# Patient Record
Sex: Male | Born: 1960
Health system: Southern US, Community
[De-identification: ages and names within clinical notes are randomized; demographics above are authoritative.]

## PROBLEM LIST (undated history)

## (undated) DIAGNOSIS — Z87442 Personal history of urinary calculi: Secondary | ICD-10-CM

## (undated) DIAGNOSIS — R3129 Other microscopic hematuria: Secondary | ICD-10-CM

## (undated) DIAGNOSIS — R0600 Dyspnea, unspecified: Secondary | ICD-10-CM

## (undated) DIAGNOSIS — N200 Calculus of kidney: Secondary | ICD-10-CM

## (undated) DIAGNOSIS — I10 Essential (primary) hypertension: Secondary | ICD-10-CM

## (undated) DIAGNOSIS — G4730 Sleep apnea, unspecified: Secondary | ICD-10-CM

## (undated) HISTORY — PX: LASIK: SHX215

## (undated) HISTORY — DX: Calculus of kidney: N20.0

## (undated) HISTORY — DX: Essential (primary) hypertension: I10

## (undated) HISTORY — DX: Dyspnea, unspecified: R06.00

## (undated) HISTORY — PX: CHOLECYSTECTOMY: SHX55

## (undated) HISTORY — PX: EYE SURGERY: SHX253

---

## 1997-12-22 HISTORY — PX: VASECTOMY: SHX75

## 1998-12-22 HISTORY — PX: HEMORRHOID SURGERY: SHX153

## 1999-11-18 ENCOUNTER — Encounter (HOSPITAL_BASED_OUTPATIENT_CLINIC_OR_DEPARTMENT_OTHER): Payer: Self-pay | Admitting: General Surgery

## 1999-11-22 ENCOUNTER — Encounter (INDEPENDENT_AMBULATORY_CARE_PROVIDER_SITE_OTHER): Payer: Self-pay | Admitting: *Deleted

## 1999-11-22 ENCOUNTER — Ambulatory Visit (HOSPITAL_COMMUNITY): Admission: RE | Admit: 1999-11-22 | Discharge: 1999-11-22 | Payer: Self-pay | Admitting: General Surgery

## 2007-08-13 ENCOUNTER — Ambulatory Visit: Payer: Self-pay | Admitting: Family Medicine

## 2007-08-13 ENCOUNTER — Encounter: Payer: Self-pay | Admitting: Cardiology

## 2007-08-20 ENCOUNTER — Ambulatory Visit: Payer: Self-pay | Admitting: Cardiology

## 2007-08-24 ENCOUNTER — Ambulatory Visit: Payer: Self-pay

## 2009-05-28 DIAGNOSIS — R0602 Shortness of breath: Secondary | ICD-10-CM | POA: Insufficient documentation

## 2009-05-28 DIAGNOSIS — I10 Essential (primary) hypertension: Secondary | ICD-10-CM | POA: Insufficient documentation

## 2009-09-26 ENCOUNTER — Ambulatory Visit: Payer: Self-pay | Admitting: Gastroenterology

## 2009-09-26 LAB — HM COLONOSCOPY

## 2010-12-22 HISTORY — PX: CHOLECYSTECTOMY: SHX55

## 2011-05-06 NOTE — Assessment & Plan Note (Signed)
[facility] OFFICE NOTE   NAME:Dalby, KELDAN EPLIN                         MRN:          [id number]  DATE:08/20/2007                            DOB:          1961-07-03    I was asked by Dr. Julieanne Manson to evaluate Raymond Gurney and to  consult on his dyspnea on exertion.   HISTORY OF PRESENT ILLNESS:  He is 50 years of age, married, has two  children.  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  He has been there for over 20 years.   He has gotten out of his usual exercise routine over the last several  months.  He has gained a little bit of weight.  He says that he does get  short of breath with short bursts of activity, such as playing  basketball or playing ultimate Frisbee.   He denies any chest pressure, tightness or other anginal symptoms.   His cardiac risk factors are pertinent only for a mildly elevated  cholesterol.   PAST MEDICAL HISTORY:  He has no known drug allergies.  He is on no  medications.  He does not smoke.  He does not use recreational products,  but he does drink a glass of wine a day.   SURGICAL HISTORY:  He has a history of hemorrhoidectomy in 2000,  vasectomy in 1999.   FAMILY HISTORY:  Negative for premature coronary disease.   SOCIAL HISTORY:  As above.   REVIEW OF SYSTEMS:  Other than the HPI, he does have some seasonal  allergies and some breathing problems.  He had recent PFTs which only  showed mild obstruction.   PHYSICAL EXAM:  He is a very pleasant individual, in no acute distress.  His blood pressure is 148/82, his pulse 82 and regular.  EKG from Dr.  Elisabeth Cara office is normal, except for some minimal nonspecific changes.  He is 5 feet 8, weighs 200 pounds.  HEENT:  Normocephalic, atraumatic.  PERRLA.  Extraocular movements  intact.  Sclerae are clear.  Facial symmetry is normal.  Dentition  satisfactory.  NECK:  Supple.  Carotid upstrokes are  equal bilaterally without bruits.  No JVD.  Thyroid is not enlarged.  Trachea is midline.  LUNGS:  Clear to auscultation and percussion, no rhonchi or wheezes.  HEART:  Reveals a nondisplaced PMI.  He has normal S1 and S2 without  murmur or gallop.  ABDOMINAL EXAM:  Soft, good bowel sounds, no midline bruit, no  hepatomegaly.  EXTREMITIES:  Reveal no edema.  Pulses are intact.  NEUROLOGIC EXAM:  Intact.   ASSESSMENT:  1. Dyspnea on exertion.  Probably secondary to short bursts of      physical activity.  He has gotten out of his usual exercise      program, which would lead to better conditioning.  2. Mild hyperlipidemia.   I have had a nice chat with Mr. Portman today.  I have recommended a cardio  and fat-burn program, specifically designed for him.  All questions were  answered.  I would also recommend a 2D echocardiogram to rule out any  structural heart disease.  If this is normal, I will see him back on a  p.r.n. basis.   We also reviewed the symptoms of angina and how to respond.     Thomas C. Daleen Squibb, MD, [facility]  Electronically Signed    TCW/MedQ  DD: 08/20/2007  DT: 08/21/2007  Job #: [id number]   cc:   Julieanne Manson

## 2011-06-10 ENCOUNTER — Encounter: Payer: Self-pay | Admitting: Cardiovascular Disease

## 2012-05-19 ENCOUNTER — Ambulatory Visit: Payer: Self-pay | Admitting: Family Medicine

## 2012-11-04 ENCOUNTER — Ambulatory Visit: Payer: Self-pay | Admitting: Otolaryngology

## 2015-01-17 LAB — LIPID PANEL
Cholesterol: 260 mg/dL — AB (ref 0–200)
HDL: 38 mg/dL (ref 35–70)
LDL Cholesterol: 114 mg/dL
LDL/HDL RATIO: 2.7
Triglycerides: 788 mg/dL — AB (ref 40–160)

## 2015-01-17 LAB — CBC AND DIFFERENTIAL
HCT: 44 % (ref 41–53)
Hemoglobin: 15.6 g/dL (ref 13.5–17.5)
Neutrophils Absolute: 1 /uL
Platelets: 303 10*3/uL (ref 150–399)
WBC: 4.2 10^3/mL

## 2015-01-17 LAB — HEPATIC FUNCTION PANEL
ALK PHOS: 49 U/L (ref 25–125)
ALT: 57 U/L — AB (ref 10–40)
AST: 51 U/L — AB (ref 14–40)

## 2015-01-17 LAB — PSA: PSA: 1

## 2015-01-17 LAB — TSH: TSH: 1.88 u[IU]/mL (ref 0.41–5.90)

## 2015-02-13 LAB — BASIC METABOLIC PANEL
BUN: 22 mg/dL — AB (ref 4–21)
Creatinine: 1.3 mg/dL (ref <=1.3)
GLUCOSE: 90 mg/dL
POTASSIUM: 4.4 mmol/L (ref 3.4–5.3)
SODIUM: 140 mmol/L (ref 137–147)

## 2015-06-21 DIAGNOSIS — I10 Essential (primary) hypertension: Secondary | ICD-10-CM | POA: Insufficient documentation

## 2015-06-21 DIAGNOSIS — E785 Hyperlipidemia, unspecified: Secondary | ICD-10-CM | POA: Insufficient documentation

## 2015-06-21 DIAGNOSIS — IMO0001 Reserved for inherently not codable concepts without codable children: Secondary | ICD-10-CM | POA: Insufficient documentation

## 2015-06-21 DIAGNOSIS — K111 Hypertrophy of salivary gland: Secondary | ICD-10-CM | POA: Insufficient documentation

## 2015-06-21 DIAGNOSIS — N529 Male erectile dysfunction, unspecified: Secondary | ICD-10-CM | POA: Insufficient documentation

## 2015-06-21 DIAGNOSIS — G2581 Restless legs syndrome: Secondary | ICD-10-CM | POA: Insufficient documentation

## 2015-06-21 DIAGNOSIS — E559 Vitamin D deficiency, unspecified: Secondary | ICD-10-CM | POA: Insufficient documentation

## 2015-07-09 ENCOUNTER — Other Ambulatory Visit: Payer: Self-pay | Admitting: Family Medicine

## 2015-07-09 ENCOUNTER — Other Ambulatory Visit: Payer: Self-pay

## 2015-07-09 MED ORDER — HYDROCHLOROTHIAZIDE 25 MG PO TABS: 25.0000 mg | ORAL_TABLET | Freq: Every day | ORAL | Status: DC

## 2015-07-09 MED ORDER — PRAVASTATIN SODIUM 40 MG PO TABS: 40.0000 mg | ORAL_TABLET | Freq: Every day | ORAL | Status: DC

## 2015-07-09 NOTE — Telephone Encounter (Signed)
Pt wants to know if he can get refills pravastatin (PRAVACHOL) 40 MG tablet , ropinirole (REQUIP) 2 MG tablet hydrochlorothiazide (HYDRODIURIL) 25 MG tablet 01/17/15 --  [place]   [facility] [place].   Just wants one weeks worth if possible.    Call back number [phone].

## 2015-07-10 ENCOUNTER — Other Ambulatory Visit: Payer: Self-pay | Admitting: Emergency Medicine

## 2015-07-10 ENCOUNTER — Telehealth: Payer: Self-pay | Admitting: Family Medicine

## 2015-07-10 DIAGNOSIS — E785 Hyperlipidemia, unspecified: Secondary | ICD-10-CM

## 2015-07-10 MED ORDER — PRAVASTATIN SODIUM 40 MG PO TABS: 40.0000 mg | ORAL_TABLET | Freq: Every day | ORAL | Status: DC

## 2015-08-15 ENCOUNTER — Encounter: Payer: Self-pay | Admitting: Family Medicine

## 2015-08-15 ENCOUNTER — Ambulatory Visit (INDEPENDENT_AMBULATORY_CARE_PROVIDER_SITE_OTHER): Payer: BLUE CROSS/BLUE SHIELD | Admitting: Family Medicine

## 2015-08-15 VITALS — BP 138/98 | HR 84 | Temp 98.1°F | Resp 16 | Wt 208.0 lb

## 2015-08-15 DIAGNOSIS — I10 Essential (primary) hypertension: Secondary | ICD-10-CM

## 2015-08-15 DIAGNOSIS — E785 Hyperlipidemia, unspecified: Secondary | ICD-10-CM | POA: Diagnosis not present

## 2015-08-15 DIAGNOSIS — G2581 Restless legs syndrome: Secondary | ICD-10-CM

## 2015-08-15 MED ORDER — DILTIAZEM HCL ER 180 MG PO CP24: 180.0000 mg | ORAL_CAPSULE | Freq: Every day | ORAL | Status: DC

## 2015-08-15 NOTE — Progress Notes (Signed)
Patient ID: Roger Chang., male   DOB: [birth year], 54 y.o.   MRN: [id number]    Subjective:  HPI  Hypertension follow up: Patient was last seen in February 2016. No changes were made at that time Renal was checked at that time. B/P at last office visit was 128/80. Patient has noticed his B/P has been running higher. He started to get swelling in his left foot Monday-8/22 and went to work to the nurse there and they said it is probably gout-Uric acid was 7.5. Patient went to ortho to make sure same day and they told him there it was not gut but arthritis type that is brought on by dehydration and was started on Indomethacin TID. Patient states he has felt dehydrated-dry mouth all the time and feels sluggish, denies chest pain, SOB or numbness,   B/P readings patient has been getting: April-155/102 May 141/97 July 148/99 Past Monday August 22-154/101   BP Readings from Last 3 Encounters:  08/15/15 138/98  02/13/15 128/80    Hyperlipidemia follow up: Last Lipid check was on 01/17/15 when he had CPE with [place]. He is on Pravastatin and is not having side effects. Lab Results  Component Value Date   CHOL 260* 01/17/2015   HDL 38 01/17/2015   LDLCALC 114 01/17/2015   TRIG 788* 01/17/2015    Prior to Admission medications   Medication Sig Start Date End Date Taking? Authorizing Provider  aspirin 81 MG tablet Take by mouth. 03/18/12   Historical Provider, MD  cholecalciferol (VITAMIN D) 1000 UNITS tablet Take by mouth. 01/15/12   Historical Provider, MD  hydrochlorothiazide (HYDRODIURIL) 25 MG tablet Take 1 tablet (25 mg total) by mouth daily. 07/09/15   Gwyneth Fernandez Hulen Shouts., MD  pravastatin (PRAVACHOL) 40 MG tablet Take 1 tablet (40 mg total) by mouth daily. 07/10/15   Dimitria Ketchum Hulen Shouts., MD  rOPINIRole (REQUIP) 2 MG tablet Take by mouth. 08/14/14   Historical Provider, MD    Patient Active Problem List   Diagnosis Date Noted  . Failure of erection 06/21/2015  . Brash 06/21/2015   . Essential (primary) hypertension 06/21/2015  . HLD (hyperlipidemia) 06/21/2015  . Hypertrophy of salivary gland 06/21/2015  . Restless leg 06/21/2015  . Avitaminosis D 06/21/2015  . Essential hypertension 05/28/2009  . DYSPNEA 05/28/2009    Past Medical History  Diagnosis Date  . Hypertension     Unspecified  . Dyspnea     Social History   Social History  . Marital Status: Married    Spouse Name: N/A  . Number of Children: N/A  . Years of Education: N/A   Occupational History  . Full time    Social History Main Topics  . Smoking status: Never Smoker   . Smokeless tobacco: Never Used  . Alcohol Use: Yes  . Drug Use: No  . Sexual Activity: Not on file   Other Topics Concern  . Not on file   Social History Narrative   Pt gets regular exercise.    Allergies  Allergen Reactions  . Amlodipine Swelling    Swelling in extremeties more than mild, pt was not comfortable with that  . Hydrochlorothiazide Swelling    Swelling of the foot, possibly contributed to developing gout, felt dehydrated, B/P not controlled  . Lisinopril Swelling    Swelling of the lips and dry mouth  . Losartan Other (See Comments)    Weird dreams, ED, swelling    Review of Systems  Constitutional: Positive for  malaise/fatigue. Negative for fever and chills.  HENT:       Dry mouth  Respiratory: Negative.   Cardiovascular: Negative.   Gastrointestinal: Negative.   Genitourinary: Positive for frequency.  Musculoskeletal: Negative.   Neurological: Positive for weakness. Negative for dizziness and tingling.  Psychiatric/Behavioral: Negative.     Immunization History  Administered Date(s) Administered  . Tdap 08/13/2007   Objective:  BP 138/98 mmHg  Pulse 84  Temp(Src) 98.1 F (36.7 C)  Resp 16  Wt 208 lb (94.348 kg)  Physical Exam  Constitutional: He is oriented to person, place, and time and well-developed, well-nourished, and in no distress.  HENT:  Head: Normocephalic and  atraumatic.  Right Ear: External ear normal.  Left Ear: External ear normal.  Nose: Nose normal.  Eyes: Conjunctivae are normal.  Neck: Neck supple.  Cardiovascular: Normal rate, regular rhythm and normal heart sounds.   Pulmonary/Chest: Effort normal and breath sounds normal.  Abdominal: Soft.  Musculoskeletal:  Tender and warm of her left foot/ankle  Neurological: He is alert and oriented to person, place, and time. Gait normal.  Skin: Skin is warm and dry.  Psychiatric: Mood, memory, affect and judgment normal.    Lab Results  Component Value Date   WBC 4.2 01/17/2015   HGB 15.6 01/17/2015   HCT 44 01/17/2015   PLT 303 01/17/2015   CHOL 260* 01/17/2015   TRIG 788* 01/17/2015   HDL 38 01/17/2015   LDLCALC 114 01/17/2015   TSH 1.88 01/17/2015   PSA 1.0 01/17/2015    CMP     Component Value Date/Time   NA 140 02/13/2015   K 4.4 02/13/2015   BUN 22* 02/13/2015   CREATININE 1.3 02/13/2015   AST 51* 01/17/2015   ALT 57* 01/17/2015   ALKPHOS 49 01/17/2015    Assessment and Plan :  1. Essential hypertension B/P not controlled. HCTZ contributing to foot swelling and possible gout. Stop HCTZ and try Diltiazem. - diltiazem (DILACOR XR) 180 MG 24 hr capsule; Take 1 capsule (180 mg total) by mouth daily.  Dispense: 30 capsule; Refill: 12  2. Restless leg Stable.  3. HLD (hyperlipidemia) Stable. 4.Gout Check UA. Recheck 2 weeks.   Patient was seen and examined by Dr. Bosie Clos and note was scribed by Samara Deist, RMA.    Julieanne Manson MD [facility] 08/15/2015 9:04 AM

## 2015-09-02 ENCOUNTER — Other Ambulatory Visit: Payer: Self-pay | Admitting: Family Medicine

## 2015-09-04 ENCOUNTER — Ambulatory Visit
Admission: RE | Admit: 2015-09-04 | Discharge: 2015-09-04 | Disposition: A | Discharge status: Home / Self Care | Payer: BLUE CROSS/BLUE SHIELD | Source: Ambulatory Visit | Attending: Family Medicine | Admitting: Family Medicine

## 2015-09-04 ENCOUNTER — Encounter: Payer: Self-pay | Admitting: Family Medicine

## 2015-09-04 ENCOUNTER — Ambulatory Visit (INDEPENDENT_AMBULATORY_CARE_PROVIDER_SITE_OTHER): Payer: BLUE CROSS/BLUE SHIELD | Admitting: Family Medicine

## 2015-09-04 VITALS — BP 144/88 | HR 82 | Temp 98.0°F | Resp 16 | Wt 210.0 lb

## 2015-09-04 DIAGNOSIS — R0602 Shortness of breath: Secondary | ICD-10-CM | POA: Diagnosis not present

## 2015-09-04 DIAGNOSIS — R5383 Other fatigue: Secondary | ICD-10-CM | POA: Diagnosis not present

## 2015-09-04 NOTE — Progress Notes (Signed)
Patient ID: Roger Burandt., male   DOB: 04-Nov-1961, 54 y.o.   MRN: [id number]    Subjective:  HPI Pt reports that he has been having shortness of breath. He reports that he has this with exertion and when laying down. He reports that he had to stop working out due to a tendon injury and foot injury and it just started working out again about a week and a half ago. He did have the SOB with excretion at that time but not when laying down. SOB when laying down started about 4 days ago. He denies any chest pain. He does report that he is tired and has not been sleeping well due to this. He reports that he has not had any increased stress in his life recently. Denies dizziness or lightheadedness. No history of asthma and has no URI symptoms.   Prior to Admission medications   Medication Sig Start Date End Date Taking? Authorizing Provider  aspirin 81 MG tablet Take by mouth. 03/18/12  Yes Historical Provider, MD  cholecalciferol (VITAMIN D) 1000 UNITS tablet Take by mouth. 01/15/12  Yes Historical Provider, MD  diltiazem (DILACOR XR) 180 MG 24 hr capsule Take 1 capsule (180 mg total) by mouth daily. 08/15/15  Yes Richard Hulen Shouts., MD  pravastatin (PRAVACHOL) 40 MG tablet Take 1 tablet (40 mg total) by mouth daily. 07/10/15  Yes Richard Hulen Shouts., MD  rOPINIRole (REQUIP) 2 MG tablet take 1/2 to 1 tablet by mouth at bedtime 09/03/15  Yes Richard Hulen Shouts., MD    Patient Active Problem List   Diagnosis Date Noted  . Failure of erection 06/21/2015  . Brash 06/21/2015  . Essential (primary) hypertension 06/21/2015  . HLD (hyperlipidemia) 06/21/2015  . Hypertrophy of salivary gland 06/21/2015  . Restless leg 06/21/2015  . Avitaminosis D 06/21/2015  . Essential hypertension 05/28/2009  . DYSPNEA 05/28/2009    Past Medical History  Diagnosis Date  . Hypertension     Unspecified  . Dyspnea     Social History   Social History  . Marital Status: Married    Spouse Name: N/A  . Number  of Children: N/A  . Years of Education: N/A   Occupational History  . Full time    Social History Main Topics  . Smoking status: Never Smoker   . Smokeless tobacco: Never Used  . Alcohol Use: Yes     Comment: occasionally  . Drug Use: No  . Sexual Activity: Not on file   Other Topics Concern  . Not on file   Social History Narrative   Pt gets regular exercise.    Allergies  Allergen Reactions  . Amlodipine Swelling    Swelling in extremeties more than mild, pt was not comfortable with that  . Hydrochlorothiazide Swelling    Swelling of the foot, possibly contributed to developing gout, felt dehydrated, B/P not controlled  . Lisinopril Swelling    Swelling of the lips and dry mouth  . Losartan Other (See Comments)    Weird dreams, ED, swelling    Review of Systems  Constitutional: Positive for malaise/fatigue.  HENT: Negative.   Eyes: Negative.   Respiratory: Positive for shortness of breath.   Cardiovascular: Positive for orthopnea.  Gastrointestinal: Negative.   Genitourinary: Negative.   Musculoskeletal: Negative.   Skin: Negative.   Neurological: Negative.   Endo/Heme/Allergies: Negative.   Psychiatric/Behavioral: Negative.     Immunization History  Administered Date(s) Administered  . Tdap 08/13/2007  Objective:  BP 144/88 mmHg  Pulse 82  Temp(Src) 98 F (36.7 C) (Oral)  Resp 16  Wt 210 lb (95.255 kg)  SpO2 99%  Physical Exam  Constitutional: He is oriented to person, place, and time and well-developed, well-nourished, and in no distress.  HENT:  Head: Normocephalic and atraumatic.  Right Ear: External ear normal.  Left Ear: External ear normal.  Nose: Nose normal.  Eyes: Conjunctivae are normal.  Neck: Neck supple.  Cardiovascular: Normal rate, regular rhythm and normal heart sounds.   Pulmonary/Chest: Effort normal and breath sounds normal.  Abdominal: Soft.  Musculoskeletal: He exhibits no edema.  Neurological: He is alert and oriented  to person, place, and time.  Skin: Skin is warm and dry.  Psychiatric: Mood, memory, affect and judgment normal.    Lab Results  Component Value Date   WBC 4.2 01/17/2015   HGB 15.6 01/17/2015   HCT 44 01/17/2015   PLT 303 01/17/2015   CHOL 260* 01/17/2015   TRIG 788* 01/17/2015   HDL 38 01/17/2015   LDLCALC 114 01/17/2015   TSH 1.88 01/17/2015   PSA 1.0 01/17/2015    CMP     Component Value Date/Time   NA 140 02/13/2015   K 4.4 02/13/2015   BUN 22* 02/13/2015   CREATININE 1.3 02/13/2015   AST 51* 01/17/2015   ALT 57* 01/17/2015   ALKPHOS 49 01/17/2015    Assessment and Plan :  1. Shortness of breath Refer to  cardiology for evaluation. Aspirin 81 mg daily - EKG 12-Lead - CBC with Differential - Comprehensive Metabolic Panel (CMET) - TSH - DG Chest 2 View; Future  2. Other fatigue Consider continuing/progressive sleep apnea.  - CBC with Differential - Comprehensive Metabolic Panel (CMET) - TSH  Julieanne Manson MD [facility] 09/04/2015 2:42 PM

## 2015-09-05 LAB — CBC WITH DIFFERENTIAL/PLATELET
BASOS ABS: 0 10*3/uL (ref 0.0–0.2)
BASOS: 1 %
EOS (ABSOLUTE): 0 10*3/uL (ref 0.0–0.4)
Eos: 1 %
Hematocrit: 45 % (ref 37.5–51.0)
Hemoglobin: 15.8 g/dL (ref 12.6–17.7)
IMMATURE GRANS (ABS): 0 10*3/uL (ref 0.0–0.1)
IMMATURE GRANULOCYTES: 0 %
LYMPHS: 47 %
Lymphocytes Absolute: 2.4 10*3/uL (ref 0.7–3.1)
MCH: 31.2 pg (ref 26.6–33.0)
MCHC: 35.1 g/dL (ref 31.5–35.7)
MCV: 89 fL (ref 79–97)
MONOS ABS: 0.5 10*3/uL (ref 0.1–0.9)
Monocytes: 10 %
NEUTROS PCT: 41 %
Neutrophils Absolute: 2.1 10*3/uL (ref 1.4–7.0)
PLATELETS: 295 10*3/uL (ref 150–379)
RBC: 5.07 x10E6/uL (ref 4.14–5.80)
RDW: 14.2 % (ref 12.3–15.4)
WBC: 5.1 10*3/uL (ref 3.4–10.8)

## 2015-09-05 LAB — TSH: TSH: 2.59 u[IU]/mL (ref 0.450–4.500)

## 2015-09-05 LAB — COMPREHENSIVE METABOLIC PANEL
A/G RATIO: 2 (ref 1.1–2.5)
ALT: 26 IU/L (ref 0–44)
AST: 25 IU/L (ref 0–40)
Albumin: 4.8 g/dL (ref 3.5–5.5)
Alkaline Phosphatase: 38 IU/L — ABNORMAL LOW (ref 39–117)
BUN/Creatinine Ratio: 11 (ref 9–20)
BUN: 13 mg/dL (ref 6–24)
Bilirubin Total: 0.4 mg/dL (ref 0.0–1.2)
CALCIUM: 9.8 mg/dL (ref 8.7–10.2)
CO2: 26 mmol/L (ref 18–29)
Chloride: 99 mmol/L (ref 97–108)
Creatinine, Ser: 1.19 mg/dL (ref 0.76–1.27)
GFR, EST AFRICAN AMERICAN: 78 mL/min/{1.73_m2} (ref >59)
GFR, EST NON AFRICAN AMERICAN: 68 mL/min/{1.73_m2} (ref >59)
GLUCOSE: 96 mg/dL (ref 65–99)
Globulin, Total: 2.4 g/dL (ref 1.5–4.5)
POTASSIUM: 4.2 mmol/L (ref 3.5–5.2)
Sodium: 142 mmol/L (ref 134–144)
TOTAL PROTEIN: 7.2 g/dL (ref 6.0–8.5)

## 2015-09-18 ENCOUNTER — Ambulatory Visit (INDEPENDENT_AMBULATORY_CARE_PROVIDER_SITE_OTHER): Payer: BLUE CROSS/BLUE SHIELD | Admitting: Family Medicine

## 2015-09-18 ENCOUNTER — Encounter: Payer: Self-pay | Admitting: Family Medicine

## 2015-09-18 VITALS — BP 142/88 | HR 80 | Temp 98.9°F | Resp 16 | Ht 68.5 in | Wt 209.0 lb

## 2015-09-18 DIAGNOSIS — R0602 Shortness of breath: Secondary | ICD-10-CM | POA: Diagnosis not present

## 2015-09-18 DIAGNOSIS — I10 Essential (primary) hypertension: Secondary | ICD-10-CM

## 2015-09-18 DIAGNOSIS — G479 Sleep disorder, unspecified: Secondary | ICD-10-CM | POA: Diagnosis not present

## 2015-09-18 MED ORDER — DILTIAZEM HCL ER COATED BEADS 360 MG PO CP24: 360.0000 mg | ORAL_CAPSULE | Freq: Every day | ORAL | Status: DC

## 2015-09-18 NOTE — Progress Notes (Signed)
Patient ID: Roger Chang., male   DOB: 1961-03-22, 54 y.o.   MRN: [id number]        Patient: Roger Chang. Male    DOB: 1961/03/27   54 y.o.   MRN: [id number] Visit Date: 09/18/2015  Today's Provider: Megan Mans, MD   Chief Complaint  Patient presents with  . Hypertension   Subjective:    HPI   Hypertension, follow-up:  BP Readings from Last 3 Encounters:  09/18/15 142/88  09/04/15 144/88  08/15/15 138/98    He was last seen for hypertension 4 weeks ago.  BP at that visit was 144/88. Management changes since that visit include D/C HCTZ and starting Diltiazem. He reports excellent compliance with treatment. He is not having side effects.  He is exercising. He is not adherent to low salt diet.   Outside blood pressures are not being checked. He is experiencing none.  Patient denies chest pain.   Cardiovascular risk factors include none.  Use of agents associated with hypertension: none.     Weight trend: stable Wt Readings from Last 3 Encounters:  09/18/15 209 lb (94.802 kg)  09/04/15 210 lb (95.255 kg)  08/15/15 208 lb (94.348 kg)    Current diet: in general, a "healthy" diet    ------------------------------------------------------------------------       Allergies  Allergen Reactions  . Amlodipine Swelling    Swelling in extremeties more than mild, pt was not comfortable with that  . Hydrochlorothiazide Swelling    Swelling of the foot, possibly contributed to developing gout, felt dehydrated, B/P not controlled  . Lisinopril Swelling    Swelling of the lips and dry mouth  . Losartan Other (See Comments)    Weird dreams, ED, swelling   Previous Medications   ASPIRIN 81 MG TABLET    Take by mouth.   CHOLECALCIFEROL (VITAMIN D) 1000 UNITS TABLET    Take by mouth.   DILTIAZEM (DILACOR XR) 180 MG 24 HR CAPSULE    Take 1 capsule (180 mg total) by mouth daily.   PRAVASTATIN (PRAVACHOL) 40 MG TABLET    Take 1 tablet (40 mg total) by mouth  daily.   ROPINIROLE (REQUIP) 2 MG TABLET    take 1/2 to 1 tablet by mouth at bedtime    Review of Systems  Constitutional: Negative.   Respiratory: Negative.   Cardiovascular: Negative.   Endocrine: Negative.   Allergic/Immunologic: Negative.   Neurological: Negative.   Psychiatric/Behavioral: Negative.     Social History  Substance Use Topics  . Smoking status: Never Smoker   . Smokeless tobacco: Never Used  . Alcohol Use: Yes     Comment: occasionally   Objective:   BP 142/88 mmHg  Pulse 80  Temp(Src) 98.9 F (37.2 C) (Oral)  Resp 16  Ht 5' 8.5" (1.74 m)  Wt 209 lb (94.802 kg)  BMI 31.31 kg/m2  SpO2 98%  Physical Exam  Constitutional: He is oriented to person, place, and time. He appears well-developed and well-nourished.  HENT:  Head: Normocephalic and atraumatic.  Right Ear: External ear normal.  Left Ear: External ear normal.  Nose: Nose normal.  Eyes: Conjunctivae are normal.  Neck: Neck supple.  Cardiovascular: Normal rate, regular rhythm and normal heart sounds.   Pulmonary/Chest: Effort normal.  Abdominal: Soft.  Musculoskeletal: Normal range of motion. He exhibits no edema.  Neurological: He is alert and oriented to person, place, and time.  Skin: Skin is warm and dry.  Psychiatric: He has a  normal mood and affect. His behavior is normal. Judgment and thought content normal.        Assessment & Plan:     1. Essential hypertension Mild edema improved off of Norvasc. - diltiazem (CARDIZEM CD) 360 MG 24 hr capsule; Take 1 capsule (360 mg total) by mouth daily.  Dispense: 30 capsule; Refill: 12  2. Shortness of breath  - Ambulatory referral to Cardiology  3. Sleep disorder Overweight but not obese. Sleep study showed no frank sleep apnea. Pt wore mouthpiece for his snoring which did help. Refer to Dr Irving Shows to go over his options with a sleep disorder that is not frank sleep apnea. I do think that it is weight related to some degree. - Ambulatory  referral to ENT 4. HLD 5.RLS      Richard Wendelyn Breslow, MD  [facility]

## 2015-11-19 ENCOUNTER — Ambulatory Visit (INDEPENDENT_AMBULATORY_CARE_PROVIDER_SITE_OTHER): Payer: BLUE CROSS/BLUE SHIELD | Admitting: Family Medicine

## 2015-11-19 ENCOUNTER — Encounter: Payer: Self-pay | Admitting: Family Medicine

## 2015-11-19 VITALS — BP 130/84 | HR 72 | Temp 98.4°F | Resp 18 | Wt 212.0 lb

## 2015-11-19 DIAGNOSIS — T148 Other injury of unspecified body region: Secondary | ICD-10-CM | POA: Diagnosis not present

## 2015-11-19 DIAGNOSIS — I10 Essential (primary) hypertension: Secondary | ICD-10-CM | POA: Diagnosis not present

## 2015-11-19 DIAGNOSIS — K219 Gastro-esophageal reflux disease without esophagitis: Secondary | ICD-10-CM

## 2015-11-19 DIAGNOSIS — J00 Acute nasopharyngitis [common cold]: Secondary | ICD-10-CM | POA: Diagnosis not present

## 2015-11-19 DIAGNOSIS — T148XXA Other injury of unspecified body region, initial encounter: Secondary | ICD-10-CM

## 2015-11-19 DIAGNOSIS — IMO0001 Reserved for inherently not codable concepts without codable children: Secondary | ICD-10-CM

## 2015-11-19 MED ORDER — RANITIDINE HCL 150 MG PO CAPS: 150.0000 mg | ORAL_CAPSULE | Freq: Two times a day (BID) | ORAL | Status: DC

## 2015-11-19 MED ORDER — AZITHROMYCIN 250 MG PO TABS: ORAL_TABLET | ORAL | Status: DC

## 2015-11-19 NOTE — Progress Notes (Signed)
Patient ID: Roger CrewsDavid W Cowens Jr., male   DOB: 10/08/1961, 54 y.o.   MRN: [id number]    Subjective:  HPI  Patient is here to follow up from his September visit. He did see ENT and he was put on Nasocort to use every morning, he was advised it will take about 1 month before he sees any improvement possibly. He was advised he will not need another sleep study at this time.  He saw Dr. Gwen PoundsKowalski also his Diltiazem was decreased to 180 mg and labetolol was added and he is taking 200 mg now. His B/P has been running when he checks it about 150/88 and 148/90. He does mention ever since he was put on Diltiazem but at 360 mg he started to get acid reflux when he laid down in the evening, Dr. Gwen PoundsKowalski nurse advised patient to take Zantac twice daily OTC as needed and he noticed that did help him but he does not take it every day only when he has trouble with it and it has become more frequent though.  He has not been sleeping well and feels more sluggish. He did start feeling bad about 3 days ago and symptoms include hoarseness, post nasal drainage, blowing up thick green mucus, no fever, no chills, no body aches. He does have some sinus pressure above his eyes. He is coughing also.  He also wants to have his left side of the rib/back looked at, this past Saturday he got hit by a tree branch. Wants to make sure that its just a bruise.  Prior to Admission medications   Medication Sig Start Date End Date Taking? Authorizing Provider  aspirin 81 MG tablet Take by mouth. 03/18/12  Yes Historical Provider, MD  cholecalciferol (VITAMIN D) 1000 UNITS tablet Take by mouth. 01/15/12  Yes Historical Provider, MD  diltiazem (CARDIZEM CD) 180 MG 24 hr capsule  11/04/15  Yes Historical Provider, MD  labetalol (NORMODYNE) 200 MG tablet Take by mouth. 11/05/15 11/04/16 Yes Historical Provider, MD  pravastatin (PRAVACHOL) 40 MG tablet Take 1 tablet (40 mg total) by mouth daily. 07/10/15  Yes Richard Hulen ShoutsL Gilbert Jr., MD  rOPINIRole  (REQUIP) 2 MG tablet take 1/2 to 1 tablet by mouth at bedtime 09/03/15  Yes Richard Hulen ShoutsL Gilbert Jr., MD    Patient Active Problem List   Diagnosis Date Noted  . Failure of erection 06/21/2015  . Brash 06/21/2015  . Essential (primary) hypertension 06/21/2015  . HLD (hyperlipidemia) 06/21/2015  . Hypertrophy of salivary gland 06/21/2015  . Restless leg 06/21/2015  . Avitaminosis D 06/21/2015  . Essential hypertension 05/28/2009  . DYSPNEA 05/28/2009    Past Medical History  Diagnosis Date  . Hypertension     Unspecified  . Dyspnea     Social History   Social History  . Marital Status: Married    Spouse Name: N/A  . Number of Children: N/A  . Years of Education: N/A   Occupational History  . Full time    Social History Main Topics  . Smoking status: Never Smoker   . Smokeless tobacco: Never Used  . Alcohol Use: Yes     Comment: occasionally  . Drug Use: No  . Sexual Activity: Not on file   Other Topics Concern  . Not on file   Social History Narrative   Pt gets regular exercise.    Allergies  Allergen Reactions  . Amlodipine Swelling    Swelling in extremeties more than mild, pt was not comfortable with  that  . Hydrochlorothiazide Swelling    Swelling of the foot, possibly contributed to developing gout, felt dehydrated, B/P not controlled  . Lisinopril Swelling    Swelling of the lips and dry mouth  . Losartan Other (See Comments)    Weird dreams, ED, swelling    Review of Systems  Constitutional: Positive for malaise/fatigue. Negative for fever and chills.  HENT: Positive for congestion.   Eyes: Negative.   Respiratory: Positive for cough.   Cardiovascular: Negative.   Gastrointestinal: Positive for heartburn.  Musculoskeletal: Positive for myalgias.  Skin: Negative.   Neurological: Negative.   Endo/Heme/Allergies: Negative.   Psychiatric/Behavioral: The patient has insomnia.     Immunization History  Administered Date(s) Administered  . Tdap  08/13/2007   Objective:  BP 130/84 mmHg  Pulse 72  Temp(Src) 98.4 F (36.9 C)  Resp 18  Wt 212 lb (96.163 kg)  SpO2 98%  Physical Exam  Constitutional: He is oriented to person, place, and time and well-developed, well-nourished, and in no distress.  HENT:  Head: Normocephalic.  Right Ear: External ear normal.  Left Ear: External ear normal.  Mouth/Throat: Oropharynx is clear and moist.  Eyes: Conjunctivae are normal. Pupils are equal, round, and reactive to light. Right eye exhibits no discharge. Left eye exhibits no discharge.  Neck: Normal range of motion. Neck supple.  Cardiovascular: Normal rate, regular rhythm, normal heart sounds and intact distal pulses.   No murmur heard. Pulmonary/Chest: Effort normal and breath sounds normal. No respiratory distress. He has no wheezes. He has no rales.  Abdominal: Soft. There is no tenderness. There is no rebound.  Neurological: He is alert and oriented to person, place, and time. Gait normal. Coordination normal.  Skin:  Baseball size bruise with slight abrasion in the left flank just below the ribs.  Psychiatric: Mood, memory, affect and judgment normal.    Lab Results  Component Value Date   WBC 5.1 09/04/2015   HGB 15.6 01/17/2015   HCT 45.0 09/04/2015   PLT 303 01/17/2015   GLUCOSE 96 09/04/2015   CHOL 260* 01/17/2015   TRIG 788* 01/17/2015   HDL 38 01/17/2015   LDLCALC 114 01/17/2015   TSH 2.590 09/04/2015   PSA 1.0 01/17/2015    CMP     Component Value Date/Time   NA 142 09/04/2015 1549   K 4.2 09/04/2015 1549   CL 99 09/04/2015 1549   CO2 26 09/04/2015 1549   GLUCOSE 96 09/04/2015 1549   BUN 13 09/04/2015 1549   CREATININE 1.19 09/04/2015 1549   CREATININE 1.3 02/13/2015   CALCIUM 9.8 09/04/2015 1549   PROT 7.2 09/04/2015 1549   ALBUMIN 4.8 09/04/2015 1549   AST 25 09/04/2015 1549   ALT 26 09/04/2015 1549   ALKPHOS 38* 09/04/2015 1549   BILITOT 0.4 09/04/2015 1549   GFRNONAA 68 09/04/2015 1549   GFRAA  78 09/04/2015 1549    Assessment and Plan :  1. Essential hypertension Stable. Advised patient that Labetolol is most likely causing the sluggish feeling. Will follow.  2. Gastroesophageal reflux disease without esophagitis Will start Zantac RX strength and follow. Advised patient to raise the head of his bed also. - ranitidine (ZANTAC) 150 MG capsule; Take 1 capsule (150 mg total) by mouth 2 (two) times daily.  Dispense: 60 capsule; Refill: 12  3. Bruising from trauma Of left flank area. Exam is normal today besides bruising and a small abrasion. Will follow.  4. Cold Advised patient to rest today, push fluids. -  azithromycin (ZITHROMAX) 250 MG tablet; As directed  Dispense: 6 tablet; Refill: 0.  If he worsens. 5.Possible mild OSA  Patient was seen and examined by Dr. Bosie Clos and note was scribed by Samara Deist, RMA.    Julieanne Manson MD [facility] 11/19/2015 9:05 AM

## 2016-01-22 ENCOUNTER — Encounter: Payer: BLUE CROSS/BLUE SHIELD | Admitting: Family Medicine

## 2016-01-30 ENCOUNTER — Encounter: Payer: BLUE CROSS/BLUE SHIELD | Admitting: Family Medicine

## 2016-02-20 ENCOUNTER — Encounter: Payer: Self-pay | Admitting: Family Medicine

## 2016-02-20 ENCOUNTER — Ambulatory Visit (INDEPENDENT_AMBULATORY_CARE_PROVIDER_SITE_OTHER): Payer: BLUE CROSS/BLUE SHIELD | Admitting: Family Medicine

## 2016-02-20 VITALS — BP 128/72 | Temp 98.4°F | Resp 16 | Wt 208.0 lb

## 2016-02-20 DIAGNOSIS — R0683 Snoring: Secondary | ICD-10-CM

## 2016-02-20 DIAGNOSIS — R3129 Other microscopic hematuria: Secondary | ICD-10-CM | POA: Diagnosis not present

## 2016-02-20 DIAGNOSIS — Z1211 Encounter for screening for malignant neoplasm of colon: Secondary | ICD-10-CM | POA: Diagnosis not present

## 2016-02-20 DIAGNOSIS — Z125 Encounter for screening for malignant neoplasm of prostate: Secondary | ICD-10-CM

## 2016-02-20 DIAGNOSIS — Z0000 Encounter for general adult medical examination without abnormal findings: Secondary | ICD-10-CM

## 2016-02-20 LAB — POCT URINALYSIS DIPSTICK
BILIRUBIN UA: NEGATIVE
GLUCOSE UA: NEGATIVE
Ketones, UA: NEGATIVE
Leukocytes, UA: NEGATIVE
Nitrite, UA: NEGATIVE
Protein, UA: NEGATIVE
SPEC GRAV UA: 1.01
UROBILINOGEN UA: 0.2
pH, UA: 7

## 2016-02-20 LAB — POCT UA - MICROSCOPIC ONLY
BACTERIA, U MICROSCOPIC: NEGATIVE
CASTS, UR, LPF, POC: NEGATIVE
CRYSTALS, UR, HPF, POC: NEGATIVE
EPITHELIAL CELLS, URINE PER MICROSCOPY: NEGATIVE
MUCUS UA: NEGATIVE
WBC, Ur, HPF, POC: NEGATIVE
Yeast, UA: NEGATIVE

## 2016-02-20 LAB — IFOBT (OCCULT BLOOD): IMMUNOLOGICAL FECAL OCCULT BLOOD TEST: NEGATIVE

## 2016-02-20 NOTE — Progress Notes (Signed)
Patient ID: Roger Adrian., male   DOB: [birth year], 55 y.o.   MRN: [id number]       Patient: Roger Manheim., Male    DOB: [birth year], 55 y.o.   MRN: [id number] Visit Date: 02/20/2016  Today's Provider: Megan Mans, MD   Chief Complaint  Patient presents with  . Annual Exam   Subjective:    Annual physical exam Roger Terhune. is a 55 y.o. male who presents today for health maintenance and complete physical. He feels well. He reports exercising about 5 days a week. He reports he is sleeping well. He sleeps on average 7hrs a night.   Last: Colonoscopy-09/26/2009. Repeat in 10 years.  Tdap- 08/13/2007.      Review of Systems  Constitutional: Negative.   HENT: Negative.   Eyes: Negative.   Respiratory: Negative.   Cardiovascular: Negative.   Gastrointestinal: Negative.   Endocrine: Negative.   Genitourinary: Negative.   Musculoskeletal: Negative.   Skin: Negative.   Allergic/Immunologic: Negative.   Neurological: Negative.   Hematological: Negative.   Psychiatric/Behavioral: Negative.     Social History      He  reports that he has never smoked. He has never used smokeless tobacco. He reports that he drinks alcohol. He reports that he does not use illicit drugs.       Social History   Social History  . Marital Status: Married    Spouse Name: N/A  . Number of Children: N/A  . Years of Education: N/A   Occupational History  . Full time    Social History Main Topics  . Smoking status: Never Smoker   . Smokeless tobacco: Never Used  . Alcohol Use: Yes     Comment: occasionally  . Drug Use: No  . Sexual Activity: Not Asked   Other Topics Concern  . None   Social History Narrative   Pt gets regular exercise.    Past Medical History  Diagnosis Date  . Hypertension     Unspecified  . Dyspnea      Patient Active Problem List   Diagnosis Date Noted  . Failure of erection 06/21/2015  . Brash 06/21/2015  . Essential (primary) hypertension  06/21/2015  . HLD (hyperlipidemia) 06/21/2015  . Hypertrophy of salivary gland 06/21/2015  . Restless leg 06/21/2015  . Avitaminosis D 06/21/2015  . Essential hypertension 05/28/2009  . DYSPNEA 05/28/2009    Past Surgical History  Procedure Laterality Date  . Hemorrhoid surgery  2000  . Vasectomy  1999  . Cholecystectomy    . Lasik      Family History        Family Status  Relation Status Death Age  . Mother Deceased 62  . Father Deceased 62  . Sister Alive   . Brother Alive   . Sister Alive   . Sister Alive   . Sister Alive   . Brother Alive   . Son Alive   . Son Alive         His family history includes Breast cancer in his mother; Cancer in his other; Heart attack in his father; Lung cancer in his father.    Allergies  Allergen Reactions  . Amlodipine Swelling    Swelling in extremeties more than mild, pt was not comfortable with that  . Hydrochlorothiazide Swelling    Swelling of the foot, possibly contributed to developing gout, felt dehydrated, B/P not controlled  . Lisinopril Swelling    Swelling of the  lips and dry mouth  . Losartan Other (See Comments)    Weird dreams, ED, swelling    Previous Medications   ASPIRIN 81 MG TABLET    Take by mouth.   AZITHROMYCIN (ZITHROMAX) 250 MG TABLET    As directed   CHOLECALCIFEROL (VITAMIN D) 1000 UNITS TABLET    Take by mouth.   DILTIAZEM (CARDIZEM CD) 180 MG 24 HR CAPSULE       LABETALOL (NORMODYNE) 200 MG TABLET    Take by mouth.   PRAVASTATIN (PRAVACHOL) 40 MG TABLET    Take 1 tablet (40 mg total) by mouth daily.   RANITIDINE (ZANTAC) 150 MG CAPSULE    Take 1 capsule (150 mg total) by mouth 2 (two) times daily.   ROPINIROLE (REQUIP) 2 MG TABLET    take 1/2 to 1 tablet by mouth at bedtime    Patient Care Team: Maple Hudson., MD as PCP - General (Family Medicine)     Objective:   Vitals: BP 128/72 mmHg  Temp(Src) 98.4 F (36.9 C)  Resp 16  Wt 208 lb (94.348 kg)   Physical Exam   Depression  Screen PHQ 2/9 Scores 08/15/2015  PHQ - 2 Score 0      Assessment & Plan:     Routine Health Maintenance and Physical Exam  Exercise Activities and Dietary recommendations Goals    None      Immunization History  Administered Date(s) Administered  . Tdap 08/13/2007    Health Maintenance  Topic Date Due  . Hepatitis C Screening  03-19-1961  . HIV Screening  07/26/1974  . INFLUENZA VACCINE  11/18/2016 (Originally 07/23/2015)  . TETANUS/TDAP  08/12/2017  . COLONOSCOPY  09/27/2019      Discussed health benefits of physical activity, and encouraged him to engage in regular exercise appropriate for his age and condition.    1. Annual physical exam  - CBC with Differential/Platelet - Comprehensive metabolic panel - Lipid panel - TSH - POCT urinalysis dipstick  2. Colon cancer screening  - IFOBT POC (occult bld, rslt in office)  3. Prostate cancer screening  - PSA  4. Snoring Refer back to Dr. Irving Shows as  patient seems to have a upper airway resistance problem. Would like him to see the patient though as as he gets older and gets heavier this is becoming more of an issue. 5. Microscopic hematuria Refer to urology. Urine dip is positive for microscopic urine shows rare white cell but 15-20 red cells per high-power field. - Ambulatory referral to Urology - POCT UA - Microscopic Only

## 2016-02-21 ENCOUNTER — Encounter: Payer: Self-pay | Admitting: Urology

## 2016-02-21 ENCOUNTER — Ambulatory Visit (INDEPENDENT_AMBULATORY_CARE_PROVIDER_SITE_OTHER): Payer: BLUE CROSS/BLUE SHIELD | Admitting: Urology

## 2016-02-21 VITALS — BP 138/81 | HR 61 | Ht 68.0 in | Wt 208.2 lb

## 2016-02-21 DIAGNOSIS — R3129 Other microscopic hematuria: Secondary | ICD-10-CM | POA: Diagnosis not present

## 2016-02-21 DIAGNOSIS — Z125 Encounter for screening for malignant neoplasm of prostate: Secondary | ICD-10-CM | POA: Diagnosis not present

## 2016-02-21 LAB — COMPREHENSIVE METABOLIC PANEL
A/G RATIO: 2 (ref 1.1–2.5)
ALK PHOS: 35 IU/L — AB (ref 39–117)
ALT: 23 IU/L (ref 0–44)
AST: 23 IU/L (ref 0–40)
Albumin: 4.5 g/dL (ref 3.5–5.5)
BILIRUBIN TOTAL: 0.4 mg/dL (ref 0.0–1.2)
BUN / CREAT RATIO: 13 (ref 9–20)
BUN: 16 mg/dL (ref 6–24)
CHLORIDE: 100 mmol/L (ref 96–106)
CO2: 25 mmol/L (ref 18–29)
Calcium: 9.8 mg/dL (ref 8.7–10.2)
Creatinine, Ser: 1.24 mg/dL (ref 0.76–1.27)
GFR calc non Af Amer: 65 mL/min/{1.73_m2} (ref >59)
GFR, EST AFRICAN AMERICAN: 75 mL/min/{1.73_m2} (ref >59)
GLUCOSE: 100 mg/dL — AB (ref 65–99)
Globulin, Total: 2.2 g/dL (ref 1.5–4.5)
POTASSIUM: 4.9 mmol/L (ref 3.5–5.2)
Sodium: 139 mmol/L (ref 134–144)
TOTAL PROTEIN: 6.7 g/dL (ref 6.0–8.5)

## 2016-02-21 LAB — CBC WITH DIFFERENTIAL/PLATELET
BASOS ABS: 0 10*3/uL (ref 0.0–0.2)
BASOS: 1 %
EOS (ABSOLUTE): 0.1 10*3/uL (ref 0.0–0.4)
Eos: 2 %
Hematocrit: 43.9 % (ref 37.5–51.0)
Hemoglobin: 14.8 g/dL (ref 12.6–17.7)
Immature Grans (Abs): 0 10*3/uL (ref 0.0–0.1)
Immature Granulocytes: 0 %
LYMPHS ABS: 2.1 10*3/uL (ref 0.7–3.1)
Lymphs: 43 %
MCH: 30.3 pg (ref 26.6–33.0)
MCHC: 33.7 g/dL (ref 31.5–35.7)
MCV: 90 fL (ref 79–97)
MONOS ABS: 0.5 10*3/uL (ref 0.1–0.9)
Monocytes: 12 %
NEUTROS ABS: 1.9 10*3/uL (ref 1.4–7.0)
Neutrophils: 42 %
PLATELETS: 264 10*3/uL (ref 150–379)
RBC: 4.88 x10E6/uL (ref 4.14–5.80)
RDW: 13.9 % (ref 12.3–15.4)
WBC: 4.6 10*3/uL (ref 3.4–10.8)

## 2016-02-21 LAB — MICROSCOPIC EXAMINATION: BACTERIA UA: NONE SEEN

## 2016-02-21 LAB — URINALYSIS, COMPLETE
Bilirubin, UA: NEGATIVE
GLUCOSE, UA: NEGATIVE
Ketones, UA: NEGATIVE
LEUKOCYTES UA: NEGATIVE
NITRITE UA: NEGATIVE
RBC, UA: NEGATIVE
Specific Gravity, UA: 1.025 (ref 1.005–1.030)
Urobilinogen, Ur: 0.2 mg/dL (ref 0.2–1.0)
pH, UA: 5.5 (ref 5.0–7.5)

## 2016-02-21 LAB — LIPID PANEL
CHOLESTEROL TOTAL: 219 mg/dL — AB (ref 100–199)
Chol/HDL Ratio: 4.9 ratio units (ref 0.0–5.0)
HDL: 45 mg/dL (ref >39)
LDL Calculated: 126 mg/dL — ABNORMAL HIGH (ref 0–99)
Triglycerides: 240 mg/dL — ABNORMAL HIGH (ref 0–149)
VLDL CHOLESTEROL CAL: 48 mg/dL — AB (ref 5–40)

## 2016-02-21 LAB — TSH: TSH: 2.05 u[IU]/mL (ref 0.450–4.500)

## 2016-02-21 LAB — PSA: PROSTATE SPECIFIC AG, SERUM: 1 ng/mL (ref 0.0–4.0)

## 2016-02-21 NOTE — Progress Notes (Signed)
02/21/2016 11:34 AM   Virginia Crews. 1961/09/18 [id number]  Referring provider: Maple Hudson., MD [address] [place], [place] [address]  Chief Complaint  Patient presents with  . Follow-up    microscopic hematuria    HPI: The patient is a 55 year old male with history of vasectomy presents for microscopic hematuria seen on a recent urinalysis during his annual physical. The patient has no urinary complaints at this time. He is never seen blood in his urine, has no history of nephrolithiasis, and voids well.  PSA (March 2017): 1.0   PMH: Past Medical History  Diagnosis Date  . Hypertension     Unspecified  . Dyspnea     Surgical History: Past Surgical History  Procedure Laterality Date  . Hemorrhoid surgery  2000  . Vasectomy  1999  . Cholecystectomy    . Lasik      Home Medications:    Medication List       This list is accurate as of: 02/21/16 11:34 AM.  Always use your most recent med list.               aspirin 81 MG tablet  Take by mouth.     azithromycin 250 MG tablet  Commonly known as:  ZITHROMAX  Reported on 02/21/2016     cholecalciferol 1000 units tablet  Commonly known as:  VITAMIN D  Take by mouth.     diltiazem 180 MG 24 hr capsule  Commonly known as:  CARDIZEM CD     labetalol 200 MG tablet  Commonly known as:  NORMODYNE  Take by mouth.     meloxicam 15 MG tablet  Commonly known as:  MOBIC  Reported on 02/21/2016     meloxicam 15 MG tablet  Commonly known as:  MOBIC  Reported on 02/21/2016     pravastatin 40 MG tablet  Commonly known as:  PRAVACHOL  Take 1 tablet (40 mg total) by mouth daily.     ranitidine 150 MG capsule  Commonly known as:  ZANTAC  Take 1 capsule (150 mg total) by mouth 2 (two) times daily.     rOPINIRole 2 MG tablet  Commonly known as:  REQUIP  take 1/2 to 1 tablet by mouth at bedtime        Allergies:  Allergies  Allergen Reactions  . Amlodipine Swelling    Swelling in  extremeties more than mild, pt was not comfortable with that  . Hydrochlorothiazide Swelling    Swelling of the foot, possibly contributed to developing gout, felt dehydrated, B/P not controlled  . Lisinopril Swelling    Swelling of the lips and dry mouth  . Losartan Other (See Comments)    Weird dreams, ED, swelling    Family History: Family History  Problem Relation Age of Onset  . Cancer Other   . Breast cancer Mother   . Heart attack Father   . Lung cancer Father     Social History:  reports that he has never smoked. He has never used smokeless tobacco. He reports that he drinks alcohol. He reports that he does not use illicit drugs.  ROS: UROLOGY Frequent Urination?: No Hard to postpone urination?: No Burning/pain with urination?: No Get up at night to urinate?: No Leakage of urine?: No Urine stream starts and stops?: No Trouble starting stream?: No Do you have to strain to urinate?: No Blood in urine?: Yes Urinary tract infection?: No Sexually transmitted disease?: No Injury to  kidneys or bladder?: No Painful intercourse?: No Weak stream?: No Erection problems?: No Penile pain?: No  Gastrointestinal Nausea?: No Vomiting?: No Indigestion/heartburn?: Yes Diarrhea?: No Constipation?: No  Constitutional Fever: No Night sweats?: No Weight loss?: No Fatigue?: No  Skin Skin rash/lesions?: No Itching?: No  Eyes Blurred vision?: No Double vision?: No  Ears/Nose/Throat Sore throat?: No Sinus problems?: No  Hematologic/Lymphatic Swollen glands?: No Easy bruising?: No  Cardiovascular Leg swelling?: No Chest pain?: No  Respiratory Cough?: No Shortness of breath?: No  Endocrine Excessive thirst?: Yes  Musculoskeletal Back pain?: No Joint pain?: No  Neurological Headaches?: No Dizziness?: No  Psychologic Depression?: No Anxiety?: No  Physical Exam: BP 138/81 mmHg  Pulse 61  Ht  (1.727 m)  Wt 208 lb 3.2 oz (94.439 kg)  BMI  31.66 kg/m2  Constitutional:  Alert and oriented, No acute distress. HEENT: [place] AT, moist mucus membranes.  Trachea midline, no masses. Cardiovascular: No clubbing, cyanosis, or edema. Respiratory: Normal respiratory effort, no increased work of breathing. GI: Abdomen is soft, nontender, nondistended, no abdominal masses GU: No CVA tenderness. Normal phallus. Testicles descended equally bilaterally. No masses. DRE: [place] 1+, smooth no nodules. Skin: No rashes, bruises or suspicious lesions. Lymph: No cervical or inguinal adenopathy. Neurologic: Grossly intact, no focal deficits, moving all 4 extremities. Psychiatric: Normal mood and affect.  Laboratory Data: Lab Results  Component Value Date   WBC 4.6 02/20/2016   HGB 15.6 01/17/2015   HCT 43.9 02/20/2016   MCV 90 02/20/2016   PLT 264 02/20/2016    Lab Results  Component Value Date   CREATININE 1.24 02/20/2016    Lab Results  Component Value Date   PSA 1.0 01/17/2015    No results found for: TESTOSTERONE  No results found for: HGBA1C  Urinalysis    Component Value Date/Time   BILIRUBINUR negative 02/20/2016 0954   PROTEINUR negative 02/20/2016 0954   UROBILINOGEN 0.2 02/20/2016 0954   NITRITE negative 02/20/2016 0954   LEUKOCYTESUR Negative 02/20/2016 0954    Assessment & Plan:   I discussed the patient the standard workup for microscopic hematuria which included CT urogram followed by office cystoscopy. We discussed some of the etiologies of microscopic hematuria which include UTI, stones, and tumors. He is aware that we find a source in approximately 10-20% of patients, but so it is recommended to undergo this evaluation. He is agreeable to proceeding. He also had a recent PSA which was normal as was his digital rectal exam. He will need a repeat PSA and DRE in one year's time.  1. Microscopic hematuria -CT Urogram -follow up for office cystoscopy after above  2. Prostate cancer screening -Due for repeat PSA/DRE  in March 2018 with PCP   Return in about 2 weeks (around 03/06/2016) for for cysto after CT urogram.  Hildred Laser, MD  [facility] [address] [place], [place] [address] [phone]

## 2016-03-05 ENCOUNTER — Ambulatory Visit
Admission: RE | Admit: 2016-03-05 | Discharge: 2016-03-05 | Disposition: A | Discharge status: Home / Self Care | Payer: BLUE CROSS/BLUE SHIELD | Source: Ambulatory Visit | Attending: Urology | Admitting: Urology

## 2016-03-05 DIAGNOSIS — K760 Fatty (change of) liver, not elsewhere classified: Secondary | ICD-10-CM | POA: Diagnosis not present

## 2016-03-05 DIAGNOSIS — R3129 Other microscopic hematuria: Secondary | ICD-10-CM | POA: Insufficient documentation

## 2016-03-05 DIAGNOSIS — N200 Calculus of kidney: Secondary | ICD-10-CM | POA: Insufficient documentation

## 2016-03-05 MED ORDER — IOHEXOL 300 MG/ML  SOLN
125.0000 mL | Freq: Once | INTRAMUSCULAR | Status: AC | PRN
  Administered 2016-03-05: 125 mL via INTRAVENOUS

## 2016-03-06 ENCOUNTER — Ambulatory Visit: Payer: BLUE CROSS/BLUE SHIELD

## 2016-03-12 ENCOUNTER — Ambulatory Visit (INDEPENDENT_AMBULATORY_CARE_PROVIDER_SITE_OTHER): Payer: BLUE CROSS/BLUE SHIELD | Admitting: Urology

## 2016-03-12 ENCOUNTER — Encounter: Payer: Self-pay | Admitting: Urology

## 2016-03-12 ENCOUNTER — Other Ambulatory Visit: Payer: Self-pay

## 2016-03-12 VITALS — BP 162/101 | HR 75 | Ht 69.0 in | Wt 206.7 lb

## 2016-03-12 DIAGNOSIS — R3129 Other microscopic hematuria: Secondary | ICD-10-CM

## 2016-03-12 LAB — URINALYSIS, COMPLETE
Bilirubin, UA: NEGATIVE
Glucose, UA: NEGATIVE
Ketones, UA: NEGATIVE
Leukocytes, UA: NEGATIVE
NITRITE UA: NEGATIVE
PH UA: 7 (ref 5.0–7.5)
Specific Gravity, UA: 1.02 (ref 1.005–1.030)
UUROB: 1 mg/dL (ref 0.2–1.0)

## 2016-03-12 LAB — MICROSCOPIC EXAMINATION
BACTERIA UA: NONE SEEN
EPITHELIAL CELLS (NON RENAL): NONE SEEN /HPF (ref 0–10)
WBC UA: NONE SEEN /HPF (ref 0–?)

## 2016-03-12 MED ORDER — LIDOCAINE HCL 2 % EX GEL
1.0000 "application " | Freq: Once | CUTANEOUS | Status: AC
  Administered 2016-03-12: 1 via URETHRAL

## 2016-03-12 MED ORDER — CIPROFLOXACIN HCL 500 MG PO TABS
500.0000 mg | ORAL_TABLET | Freq: Once | ORAL | Status: AC
  Administered 2016-03-12: 500 mg via ORAL

## 2016-03-12 MED ORDER — TADALAFIL 20 MG PO TABS: ORAL_TABLET | ORAL | Status: DC

## 2016-03-12 NOTE — Telephone Encounter (Signed)
Received a letter from patient asking to have RX sent in for Cialis, per Dr. Sullivan LoneGilbert that is fine-aa

## 2016-03-12 NOTE — Progress Notes (Signed)
03/12/2016 9:26 AM   Roger Chang. [birth year] [id number]  Referring provider: Maple Hudson., MD [address] [place], [place] [address]  No chief complaint on file.   HPI: The patient is a 55 year old male with history of vasectomy presents for microscopic hematuria seen on a recent urinalysis during his annual physical. The patient has no urinary complaints at this time. He is never seen blood in his urine, has no history of nephrolithiasis, and voids well.  PSA (March 2017): 1.0  CT imaging showed 3 small calculi within the left renal collecting system. The largest was 5 mm that dropped into the UPJ when the patient was prone and was thought to be intermittently obstructing. It is visible on the scout film.    PMH: Past Medical History  Diagnosis Date  . Hypertension     Unspecified  . Dyspnea     Surgical History: Past Surgical History  Procedure Laterality Date  . Hemorrhoid surgery  2000  . Vasectomy  1999  . Cholecystectomy    . Lasik      Home Medications:    Medication List       This list is accurate as of: 03/12/16  9:26 AM.  Always use your most recent med list.               aspirin 81 MG tablet  Take by mouth.     azithromycin 250 MG tablet  Commonly known as:  ZITHROMAX  Reported on 02/21/2016     cholecalciferol 1000 units tablet  Commonly known as:  VITAMIN D  Take by mouth.     diltiazem 180 MG 24 hr capsule  Commonly known as:  CARDIZEM CD     labetalol 200 MG tablet  Commonly known as:  NORMODYNE  Take by mouth.     meloxicam 15 MG tablet  Commonly known as:  MOBIC  Reported on 02/21/2016     meloxicam 15 MG tablet  Commonly known as:  MOBIC  Reported on 02/21/2016     pravastatin 40 MG tablet  Commonly known as:  PRAVACHOL  Take 1 tablet (40 mg total) by mouth daily.     ranitidine 150 MG capsule  Commonly known as:  ZANTAC  Take 1 capsule (150 mg total) by mouth 2 (two) times daily.     rOPINIRole  2 MG tablet  Commonly known as:  REQUIP  take 1/2 to 1 tablet by mouth at bedtime        Allergies:  Allergies  Allergen Reactions  . Amlodipine Swelling    Swelling in extremeties more than mild, pt was not comfortable with that  . Hydrochlorothiazide Swelling    Swelling of the foot, possibly contributed to developing gout, felt dehydrated, B/P not controlled  . Lisinopril Swelling    Swelling of the lips and dry mouth  . Losartan Other (See Comments)    Weird dreams, ED, swelling    Family History: Family History  Problem Relation Age of Onset  . Cancer Other   . Breast cancer Mother   . Heart attack Father   . Lung cancer Father     Social History:  reports that he has never smoked. He has never used smokeless tobacco. He reports that he drinks alcohol. He reports that he does not use illicit drugs.  ROS:  Physical Exam: There were no vitals taken for this visit.  Constitutional:  Alert and oriented, No acute distress. HEENT: [place] AT, moist mucus membranes.  Trachea midline, no masses. Cardiovascular: No clubbing, cyanosis, or edema. Respiratory: Normal respiratory effort, no increased work of breathing. GI: Abdomen is soft, nontender, nondistended, no abdominal masses GU: No CVA tenderness.  Skin: No rashes, bruises or suspicious lesions. Lymph: No cervical or inguinal adenopathy. Neurologic: Grossly intact, no focal deficits, moving all 4 extremities. Psychiatric: Normal mood and affect.  Laboratory Data: Lab Results  Component Value Date   WBC 4.6 02/20/2016   HGB 15.6 01/17/2015   HCT 43.9 02/20/2016   MCV 90 02/20/2016   PLT 264 02/20/2016    Lab Results  Component Value Date   CREATININE 1.24 02/20/2016    Lab Results  Component Value Date   PSA 1.0 01/17/2015    No results found for: TESTOSTERONE  No results found for: HGBA1C  Urinalysis    Component Value Date/Time    APPEARANCEUR Clear 02/21/2016 1049   GLUCOSEU Negative 02/21/2016 1049   BILIRUBINUR Negative 02/21/2016 1049   BILIRUBINUR negative 02/20/2016 0954   PROTEINUR Trace* 02/21/2016 1049   PROTEINUR negative 02/20/2016 0954   UROBILINOGEN 0.2 02/20/2016 0954   NITRITE Negative 02/21/2016 1049   NITRITE negative 02/20/2016 0954   LEUKOCYTESUR Negative 02/21/2016 1049   LEUKOCYTESUR Negative 02/20/2016 0954    Pertinent Imaging: CLINICAL DATA: Painless microscopic hematuria. No history of nephrolithiasis. Previous vasectomy.  EXAM: CT ABDOMEN AND PELVIS WITHOUT AND WITH CONTRAST  TECHNIQUE: Multidetector CT imaging of the abdomen and pelvis was performed following the standard protocol before and following the bolus administration of intravenous contrast.  CONTRAST: 125mL OMNIPAQUE IOHEXOL 300 MG/ML SOLN  COMPARISON: None.  FINDINGS: Lower chest: Clear lung bases. No significant pleural or pericardial effusion.  Hepatobiliary: The liver demonstrates diffuse steatosis. No focal hepatic lesions are identified post contrast. No significant biliary dilatation status post cholecystectomy.  Pancreas: Unremarkable. No pancreatic ductal dilatation or surrounding inflammatory changes.  Spleen: Normal in size without focal abnormality.  Adrenals/Urinary Tract: Both adrenal glands appear normal. Pre contrast images demonstrate 3 left-sided renal calculi, the largest measuring 5 mm in the renal pelvis on image number 38. There is no evidence of right renal calculus. No ureteral or bladder calculi are present. Post-contrast, both kidneys enhance normally. There is no evidence of enhancing renal mass. Delayed images result in segmental visualization of the ureters. No urothelial abnormalities are identified. On the delayed images which were obtained prone, the dominant calculus within the left renal pelvis has dropped into the proximal ureter, but is not causing  high-grade obstruction. No bladder abnormalities are seen.  Stomach/Bowel: No evidence of bowel wall thickening, distention or surrounding inflammatory change. Mild distal colonic diverticulosis. The appendix appears normal.  Vascular/Lymphatic: There are no enlarged abdominal or pelvic lymph nodes. Minimal aortic atherosclerosis.  Reproductive: The prostate gland appears unremarkable. Vasectomy clips are noted.  Other: Small umbilical hernia containing only fat.  Musculoskeletal: No acute or significant osseous findings. Mild lower lumbar spine facet disease.  IMPRESSION: 1. There are 3 calculi within the left renal collecting system. The largest calculus measuring 5 mm is positioned within the renal pelvis with the patient supine, although drops into the UPJ with the patient prone. This may be intermittently obstructing. 2. No evidence of renal mass or urothelial lesion. 3. Hepatic steatosis.   Cystoscopy Procedure Note  Patient identification was confirmed, informed consent was obtained, and patient was prepped  using Betadine solution.  Lidocaine jelly was administered per urethral meatus.    Preoperative abx where received prior to procedure.     Pre-Procedure: - Inspection reveals a normal caliber ureteral meatus.  Procedure: The flexible cystoscope was introduced without difficulty - No urethral strictures/lesions are present. - Normal prostate  - Normal bladder neck - Bilateral ureteral orifices identified - Bladder mucosa  reveals no ulcers, tumors, or lesions - No bladder stones - No trabeculation  Retroflexion shows no intravesical lobe   Post-Procedure: - Patient tolerated the procedure well    Assessment & Plan:    The patient was found to have an intermittently obstructing 5 mm calculus on CT scan. He also has a punctate left lower pole calluses. It appears visible on the scout film on my review. I discussed treatment options the patient  which include ureteroscopy and her portal shockwave lithotripsy. Due to the fact of its size and location, I recommended pressure portal shockwave lithotripsy. He understands risks, benefits, and indications for this procedure. He understands the risks include bleeding and infection. He understands her also the risk for secondary procedure if the stone fragments become lodged in his ureter. All questions were answered. The patient chooses to proceed.  1. Left renal calculus -left ESWL of 5 mm calculus  2. Microscopic hematuria Likely secondary to above. Workup otherwise negative.  There are no diagnoses linked to this encounter.  No Follow-up on file.  Hildred Laser, MD  [facility] [address], [address] [place], [place] [address] [phone]

## 2016-03-17 ENCOUNTER — Telehealth: Payer: Self-pay | Admitting: Radiology

## 2016-03-17 NOTE — Telephone Encounter (Signed)
LMOM. Need to notify pt to hold ASA 81mg  3 days prior to ESWL scheduled 04/10/16.

## 2016-03-22 DIAGNOSIS — R3129 Other microscopic hematuria: Secondary | ICD-10-CM

## 2016-03-22 HISTORY — DX: Other microscopic hematuria: R31.29

## 2016-03-24 NOTE — Telephone Encounter (Signed)
LMOM

## 2016-03-31 NOTE — Telephone Encounter (Signed)
Reminded pt of ESWL scheduled 04/10/16, [facility] Advised pt to hold ASA 81mg  3 days prior to procedure beginning 04/07/16. Medication may be resumed after procedure per Dr's instructions. Pt voices understanding.

## 2016-04-09 ENCOUNTER — Encounter: Payer: Self-pay | Admitting: *Deleted

## 2016-04-10 ENCOUNTER — Ambulatory Visit
Admission: RE | Admit: 2016-04-10 | Discharge: 2016-04-10 | Disposition: A | Discharge status: Home / Self Care | Payer: BLUE CROSS/BLUE SHIELD | Source: Ambulatory Visit | Attending: Urology | Admitting: Urology

## 2016-04-10 ENCOUNTER — Encounter
Admission: RE | Disposition: A | Discharge status: Home / Self Care | Payer: Self-pay | Source: Ambulatory Visit | Attending: Urology

## 2016-04-10 ENCOUNTER — Encounter: Payer: Self-pay | Admitting: *Deleted

## 2016-04-10 DIAGNOSIS — I10 Essential (primary) hypertension: Secondary | ICD-10-CM | POA: Diagnosis not present

## 2016-04-10 DIAGNOSIS — N200 Calculus of kidney: Secondary | ICD-10-CM | POA: Diagnosis not present

## 2016-04-10 HISTORY — PX: EXTRACORPOREAL SHOCK WAVE LITHOTRIPSY: SHX1557

## 2016-04-10 HISTORY — DX: Other microscopic hematuria: R31.29

## 2016-04-10 SURGERY — LITHOTRIPSY, ESWL
Anesthesia: Moderate Sedation | Laterality: Left

## 2016-04-10 MED ORDER — DEXTROSE-NACL 5-0.45 % IV SOLN
INTRAVENOUS | Status: DC
  Administered 2016-04-10: 12:00:00 via INTRAVENOUS

## 2016-04-10 MED ORDER — DIPHENHYDRAMINE HCL 25 MG PO CAPS
ORAL_CAPSULE | ORAL | Status: AC
  Filled 2016-04-10: qty 1

## 2016-04-10 MED ORDER — DIPHENHYDRAMINE HCL 25 MG PO CAPS
25.0000 mg | ORAL_CAPSULE | ORAL | Status: AC
  Administered 2016-04-10: 25 mg via ORAL

## 2016-04-10 MED ORDER — DIAZEPAM 5 MG PO TABS
10.0000 mg | ORAL_TABLET | Freq: Once | ORAL | Status: AC
  Administered 2016-04-10: 10 mg via ORAL

## 2016-04-10 MED ORDER — DEXTROSE 5 % IV SOLN
1.0000 g | INTRAVENOUS | Status: DC
  Administered 2016-04-10: 1 g via INTRAVENOUS
  Filled 2016-04-10 (×2): qty 10

## 2016-04-10 MED ORDER — DIAZEPAM 5 MG PO TABS
ORAL_TABLET | ORAL | Status: AC
  Filled 2016-04-10: qty 2

## 2016-04-10 MED ORDER — MIDAZOLAM HCL 2 MG/2ML IJ SOLN
1.0000 mg | Freq: Once | INTRAMUSCULAR | Status: AC
  Administered 2016-04-10: 1 mg via INTRAMUSCULAR

## 2016-04-10 MED ORDER — MIDAZOLAM HCL 2 MG/2ML IJ SOLN
INTRAMUSCULAR | Status: AC
  Filled 2016-04-10: qty 2

## 2016-04-10 NOTE — Discharge Instructions (Signed)

## 2016-04-11 ENCOUNTER — Encounter: Payer: Self-pay | Admitting: Urology

## 2016-04-17 DIAGNOSIS — M7732 Calcaneal spur, left foot: Secondary | ICD-10-CM | POA: Diagnosis not present

## 2016-04-23 DIAGNOSIS — M7732 Calcaneal spur, left foot: Secondary | ICD-10-CM | POA: Diagnosis not present

## 2016-04-25 ENCOUNTER — Ambulatory Visit (INDEPENDENT_AMBULATORY_CARE_PROVIDER_SITE_OTHER): Payer: BLUE CROSS/BLUE SHIELD | Admitting: Urology

## 2016-04-25 ENCOUNTER — Ambulatory Visit
Admission: RE | Admit: 2016-04-25 | Discharge: 2016-04-25 | Disposition: A | Discharge status: Home / Self Care | Payer: BLUE CROSS/BLUE SHIELD | Source: Ambulatory Visit | Attending: Urology | Admitting: Urology

## 2016-04-25 ENCOUNTER — Encounter: Payer: Self-pay | Admitting: Urology

## 2016-04-25 VITALS — BP 130/83 | HR 71 | Ht 69.0 in

## 2016-04-25 DIAGNOSIS — R3129 Other microscopic hematuria: Secondary | ICD-10-CM

## 2016-04-25 DIAGNOSIS — N200 Calculus of kidney: Secondary | ICD-10-CM

## 2016-04-25 DIAGNOSIS — R109 Unspecified abdominal pain: Secondary | ICD-10-CM | POA: Insufficient documentation

## 2016-04-25 DIAGNOSIS — N201 Calculus of ureter: Secondary | ICD-10-CM

## 2016-04-25 LAB — URINALYSIS, COMPLETE
BILIRUBIN UA: NEGATIVE
Glucose, UA: NEGATIVE
KETONES UA: NEGATIVE
Leukocytes, UA: NEGATIVE
NITRITE UA: NEGATIVE
PH UA: 5 (ref 5.0–7.5)
Protein, UA: NEGATIVE
Urobilinogen, Ur: 0.2 mg/dL (ref 0.2–1.0)

## 2016-04-25 LAB — MICROSCOPIC EXAMINATION
Bacteria, UA: NONE SEEN
WBC, UA: NONE SEEN /hpf (ref 0–?)

## 2016-04-28 ENCOUNTER — Telehealth: Payer: Self-pay

## 2016-04-28 DIAGNOSIS — N200 Calculus of kidney: Secondary | ICD-10-CM

## 2016-04-28 NOTE — Telephone Encounter (Signed)
LMOM

## 2016-04-28 NOTE — Telephone Encounter (Signed)
-----   Message from Harle BattiestShannon A McGowan, PA-C sent at 04/25/2016 12:48 PM EDT ----- Please let the patient know that his ureteral stone is not visible on x-ray.  We will proceed with RUS in one month.

## 2016-04-28 NOTE — Telephone Encounter (Signed)
LMOM- stone not visible on xray. Need RUS in 33mo.

## 2016-04-30 NOTE — Progress Notes (Signed)
04/25/2016 8:41 AM   Roger Crewsavid W [name] 01/22/1961 [id number]  Referring provider: Maple Hudsonichard L Roger Jr., MD [address] [place], [place] [id number]  Chief Complaint  Patient presents with  . Routine Post Op    2 week follow up Litho    HPI: Patient is a 55 year old African-American male who is status post ESWL for a 5 mm UPJ stone with Dr. Sherryl Chang.  His post procedural course has been uneventful.  He stated he was uncomfortable that night and had episodes of gross hematuria.  The pain and hematuria has since resolved.  He has not passed a distinct stone fragment.  He has not had a recent flank pain, nausea, vomiting, fever or chills.  He is not experiencing any urinary symptoms at this time.  KUB taken today did not demonstrate the UPJ stone.  He does have a remaining 6 mm stone over the lower pole of the left kidney.  UA is positive for greater than 30 RBC's/hpf.   PMH: Past Medical History  Diagnosis Date  . Hypertension     Unspecified  . Dyspnea   . Microscopic hematuria 03/2016    Surgical History: Past Surgical History  Procedure Laterality Date  . Hemorrhoid surgery  2000  . Vasectomy  1999  . Cholecystectomy    . Lasik    . Extracorporeal shock wave lithotripsy Left 04/10/2016    Procedure: EXTRACORPOREAL SHOCK WAVE LITHOTRIPSY (ESWL);  Surgeon: Roger LaserBrian James Budzyn, MD;  Location: [facility] ORS;  Service: Urology;  Laterality: Left;    Home Medications:    Medication List       This list is accurate as of: 04/25/16 11:59 PM.  Always use your most recent med list.               aspirin 81 MG tablet  Take by mouth.     azithromycin 250 MG tablet  Commonly known as:  ZITHROMAX  Reported on 04/25/2016     cholecalciferol 1000 units tablet  Commonly known as:  VITAMIN D  Take by mouth.     diltiazem 180 MG 24 hr capsule  Commonly known as:  CARDIZEM CD     labetalol 200 MG tablet  Commonly known as:  NORMODYNE  Take by mouth.     meloxicam 15  MG tablet  Commonly known as:  MOBIC  Reported on 02/21/2016     meloxicam 15 MG tablet  Commonly known as:  MOBIC  Reported on 04/25/2016     pravastatin 40 MG tablet  Commonly known as:  PRAVACHOL  Take 1 tablet (40 mg total) by mouth daily.     ranitidine 150 MG capsule  Commonly known as:  ZANTAC  Take 1 capsule (150 mg total) by mouth 2 (two) times daily.     rOPINIRole 2 MG tablet  Commonly known as:  REQUIP  take 1/2 to 1 tablet by mouth at bedtime     tadalafil 20 MG tablet  Commonly known as:  CIALIS  1 tablet every other day        Allergies:  Allergies  Allergen Reactions  . Lisinopril Swelling    Swelling of the lips and dry mouth  . Amlodipine Swelling    Swelling in extremeties more than mild, pt was not comfortable with that  . Hydrochlorothiazide Swelling    Swelling of the foot, possibly contributed to developing gout, felt dehydrated, B/P not controlled  . Losartan Other (See Comments)  Weird dreams, ED, swelling    Family History: Family History  Problem Relation Age of Onset  . Cancer Other   . Breast cancer Mother   . Heart attack Father   . Lung cancer Father   . Kidney disease Neg Hx   . Prostate cancer Neg Hx     Social History:  reports that he has never smoked. He has never used smokeless tobacco. He reports that he drinks alcohol. He reports that he does not use illicit drugs.  ROS: UROLOGY Frequent Urination?: No Hard to postpone urination?: No Burning/pain with urination?: No Get up at night to urinate?: No Leakage of urine?: No Urine stream starts and stops?: No Trouble starting stream?: No Do you have to strain to urinate?: No Blood in urine?: No Urinary tract infection?: No Sexually transmitted disease?: No Injury to kidneys or bladder?: No Painful intercourse?: No Weak stream?: No Erection problems?: No Penile pain?: No  Gastrointestinal Nausea?: No Vomiting?: No Indigestion/heartburn?: No Diarrhea?:  No Constipation?: No  Constitutional Fever: No Night sweats?: No Weight loss?: No Fatigue?: No  Skin Skin rash/lesions?: No Itching?: No  Eyes Blurred vision?: No Double vision?: No  Ears/Nose/Throat Sore throat?: No Sinus problems?: No  Hematologic/Lymphatic Swollen glands?: No Easy bruising?: No  Cardiovascular Leg swelling?: No Chest pain?: No  Respiratory Cough?: No Shortness of breath?: No  Endocrine Excessive thirst?: No  Musculoskeletal Back pain?: No Joint pain?: No  Neurological Headaches?: No Dizziness?: No  Psychologic Depression?: No Anxiety?: No  Physical Exam: BP 130/83 mmHg  Pulse 71  Ht  (1.753 m)  Constitutional: Well nourished. Alert and oriented, No acute distress. HEENT: [place] AT, moist mucus membranes. Trachea midline, no masses. Cardiovascular: No clubbing, cyanosis, or edema. Respiratory: Normal respiratory effort, no increased work of breathing. GI: Abdomen is soft, non tender, non distended, no abdominal masses. Liver and spleen not palpable.  No hernias appreciated.  Stool sample for occult testing is not indicated.   GU: No CVA tenderness.  No bladder fullness or masses.   Skin: No rashes, bruises or suspicious lesions. Lymph: No cervical or inguinal adenopathy. Neurologic: Grossly intact, no focal deficits, moving all 4 extremities. Psychiatric: Normal mood and affect.  Laboratory Data: Lab Results  Component Value Date   WBC 4.6 02/20/2016   HGB 15.6 01/17/2015   HCT 43.9 02/20/2016   MCV 90 02/20/2016   PLT 264 02/20/2016    Lab Results  Component Value Date   CREATININE 1.24 02/20/2016    Lab Results  Component Value Date   PSA 1.0 01/17/2015   PSA  1.0 ng/mL on 02/20/2016  Lab Results  Component Value Date   TSH 2.050 02/20/2016       Component Value Date/Time   CHOL 219* 02/20/2016 0950   CHOL 260* 01/17/2015   HDL 45 02/20/2016 0950   HDL 38 01/17/2015   CHOLHDL 4.9 02/20/2016 0950    LDLCALC 126* 02/20/2016 0950   LDLCALC 114 01/17/2015    Lab Results  Component Value Date   AST 23 02/20/2016   Lab Results  Component Value Date   ALT 23 02/20/2016     Urinalysis Results for orders placed or performed in visit on 04/25/16  Microscopic Examination  Result Value Ref Range   WBC, UA None seen 0 -  5 /hpf   RBC, UA >30 (A) 0 -  2 /hpf   Epithelial Cells (non renal) 0-10 0 - 10 /hpf   Mucus, UA Present (A) Not  Estab.   Bacteria, UA None seen None seen/Few  Urinalysis, Complete  Result Value Ref Range   Specific Gravity, UA >1.030 (H) 1.005 - 1.030   pH, UA 5.0 5.0 - 7.5   Color, UA Yellow Yellow   Appearance Ur Cloudy (A) Clear   Leukocytes, UA Negative Negative   Protein, UA Negative Negative/Trace   Glucose, UA Negative Negative   Ketones, UA Negative Negative   RBC, UA 3+ (A) Negative   Bilirubin, UA Negative Negative   Urobilinogen, Ur 0.2 0.2 - 1.0 mg/dL   Nitrite, UA Negative Negative   Microscopic Examination See below:     Pertinent Imaging: CLINICAL DATA: Status post lithotripsy on April 10, 2016 for left-sided kidney stones, follow-up study, patient reports left flank pain.  EXAM: ABDOMEN - 1 VIEW  COMPARISON: CT urogram dated March 05 2016  FINDINGS: There is an approximately 2 x 6 mm stone projecting over the lower pole of the left kidney. No definite stones are observed elsewhere on the left. On the right no stones are evident. There are phleboliths within the pelvis. The bowel gas pattern is unremarkable. The bony structures exhibit no acute abnormality.  IMPRESSION: 2 x 6 mm lower pole stone on the left. No definite stones observed elsewhere.   Electronically Signed  By: Journee Swaz[place] M.D.  On: 04/25/2016 12:23  Assessment & Plan:    1. S/p left ESWL:    Doing well.   Treated stone is not visible on today's KUB.   Patient will be undergoing a renal ultrasound in 1 month.   2. Kidney stones:   Patient has  remaining stone in the lower pole of his left kidney.  He will be undergoing a renal ultrasound in 1 month.  3. Microscopic hematuria:    We will continue to monitor the patient's UA  to ensure the hematuria has resolved.  If hematuria persists, we will pursue a hematuria workup with CT Urogram and cystoscopy if appropriate.  - Urinalysis, Complete - DG Abd 1 View; Future   Return in about 1 month (around 05/26/2016) for RUS report.  These notes generated with voice recognition software. I apologize for typographical errors.  Michiel Cowboy, PA-C  [facility] [address], [address] [place], [place] [address] [phone]

## 2016-05-01 DIAGNOSIS — M7732 Calcaneal spur, left foot: Secondary | ICD-10-CM | POA: Diagnosis not present

## 2016-05-01 DIAGNOSIS — N200 Calculus of kidney: Secondary | ICD-10-CM | POA: Insufficient documentation

## 2016-05-01 DIAGNOSIS — N201 Calculus of ureter: Secondary | ICD-10-CM | POA: Insufficient documentation

## 2016-05-01 DIAGNOSIS — R3129 Other microscopic hematuria: Secondary | ICD-10-CM | POA: Insufficient documentation

## 2016-05-07 DIAGNOSIS — M7732 Calcaneal spur, left foot: Secondary | ICD-10-CM | POA: Diagnosis not present

## 2016-05-08 ENCOUNTER — Telehealth: Payer: Self-pay

## 2016-05-08 NOTE — Telephone Encounter (Signed)
Pt pharmacy sent a refill of flomax. Per Dr. Sherryl BartersBudzyn pt was only given flomax for stones but could have a refill if he wanted to keep taking it. Spoke with pt in reference to flomax refill and pt stated he is not currently taking medication and does not want the refill.

## 2016-05-09 ENCOUNTER — Other Ambulatory Visit: Payer: Self-pay

## 2016-05-09 ENCOUNTER — Telehealth: Payer: Self-pay | Admitting: Radiology

## 2016-05-09 NOTE — Telephone Encounter (Signed)
Roger Chang was supposed to call this patient to see why he wanted a refill. He was only on it to pass stones which are now gone. Do we know if she contacted him yet to see why there is a refill request?

## 2016-05-09 NOTE — Telephone Encounter (Signed)
LMOM

## 2016-05-09 NOTE — Telephone Encounter (Signed)
Pt states he doesn't need a refill of the Flomax since passing the stone.

## 2016-05-09 NOTE — Telephone Encounter (Signed)
Pt's pharmacy requests [place]ams[place] [facility] [place]. Please place order.

## 2016-05-09 NOTE — Telephone Encounter (Signed)
Pt states he has not requested a refill. Pharmacy notified medication does not need to be refilled.

## 2016-05-14 DIAGNOSIS — M7732 Calcaneal spur, left foot: Secondary | ICD-10-CM | POA: Diagnosis not present

## 2016-05-20 ENCOUNTER — Ambulatory Visit
Admission: RE | Admit: 2016-05-20 | Discharge: 2016-05-20 | Disposition: A | Discharge status: Home / Self Care | Payer: BLUE CROSS/BLUE SHIELD | Source: Ambulatory Visit | Attending: Urology | Admitting: Urology

## 2016-05-20 DIAGNOSIS — R3129 Other microscopic hematuria: Secondary | ICD-10-CM | POA: Insufficient documentation

## 2016-05-20 DIAGNOSIS — N200 Calculus of kidney: Secondary | ICD-10-CM | POA: Diagnosis not present

## 2016-05-29 ENCOUNTER — Ambulatory Visit (INDEPENDENT_AMBULATORY_CARE_PROVIDER_SITE_OTHER): Payer: BLUE CROSS/BLUE SHIELD | Admitting: Urology

## 2016-05-29 VITALS — BP 141/85 | HR 70 | Ht 69.0 in | Wt 207.0 lb

## 2016-05-29 DIAGNOSIS — R3129 Other microscopic hematuria: Secondary | ICD-10-CM

## 2016-05-29 DIAGNOSIS — N200 Calculus of kidney: Secondary | ICD-10-CM

## 2016-05-29 LAB — URINALYSIS, COMPLETE
BILIRUBIN UA: NEGATIVE
Glucose, UA: NEGATIVE
KETONES UA: NEGATIVE
LEUKOCYTES UA: NEGATIVE
NITRITE UA: NEGATIVE
PH UA: 5.5 (ref 5.0–7.5)
Protein, UA: NEGATIVE
RBC UA: NEGATIVE
SPEC GRAV UA: 1.025 (ref 1.005–1.030)
UUROB: 0.2 mg/dL (ref 0.2–1.0)

## 2016-05-29 LAB — MICROSCOPIC EXAMINATION
BACTERIA UA: NONE SEEN
WBC UA: NONE SEEN /HPF (ref 0–?)

## 2016-05-29 NOTE — Progress Notes (Signed)
8:46 AM   Roger Chang. 08-06-1961 [id number]  Referring provider: Maple Hudson., MD [address] [place], [place] [address]  Chief Complaint  Patient presents with  . Follow-up    HPI: Patient is a 55 year old African-American male who presents today to discuss his renal ultrasound report.  Background history Patient is status post ESWL for a 5 mm UPJ stone with Dr. Sherryl Barters on 04/10/2016.  His post procedural course has been uneventful.  He stated he was uncomfortable that night and had episodes of gross hematuria.  The pain and hematuria has since resolved.  He has not passed a distinct stone fragment.  KUB taken today did not demonstrate the UPJ stone.  He does have a remaining 6 mm stone over the lower pole of the left kidney.    Patient has been doing well over the last month. He is not had gross hematuria, dysuria, suprapubic pain or flank pain. He has not had fevers, chills, nausea or vomiting. His UA today is unremarkable.  Ultrasound completed on 05/20/2016 was normal.  Patient was unable to capture stone fragments, so a stone analysis could not be performed.   PMH: Past Medical History  Diagnosis Date  . Hypertension     Unspecified  . Dyspnea   . Microscopic hematuria 03/2016    Surgical History: Past Surgical History  Procedure Laterality Date  . Hemorrhoid surgery  2000  . Vasectomy  1999  . Cholecystectomy    . Lasik    . Extracorporeal shock wave lithotripsy Left 04/10/2016    Procedure: EXTRACORPOREAL SHOCK WAVE LITHOTRIPSY (ESWL);  Surgeon: Hildred Laser, MD;  Location: [facility] ORS;  Service: Urology;  Laterality: Left;    Home Medications:    Medication List       This list is accurate as of: 05/29/16  8:46 AM.  Always use your most recent med list.               aspirin 81 MG tablet  Take by mouth.     cholecalciferol 1000 units tablet  Commonly known as:  VITAMIN D  Take by mouth.     diltiazem 180 MG 24 hr  capsule  Commonly known as:  CARDIZEM CD     labetalol 200 MG tablet  Commonly known as:  NORMODYNE  Take by mouth.     meloxicam 15 MG tablet  Commonly known as:  MOBIC  Reported on 04/25/2016     pravastatin 40 MG tablet  Commonly known as:  PRAVACHOL  Take 1 tablet (40 mg total) by mouth daily.     ranitidine 150 MG capsule  Commonly known as:  ZANTAC  Take 1 capsule (150 mg total) by mouth 2 (two) times daily.     rOPINIRole 2 MG tablet  Commonly known as:  REQUIP  take 1/2 to 1 tablet by mouth at bedtime     tadalafil 20 MG tablet  Commonly known as:  CIALIS  1 tablet every other day        Allergies:  Allergies  Allergen Reactions  . Lisinopril Swelling    Swelling of the lips and dry mouth  . Amlodipine Swelling    Swelling in extremeties more than mild, pt was not comfortable with that  . Hydrochlorothiazide Swelling    Swelling of the foot, possibly contributed to developing gout, felt dehydrated, B/P not controlled  . Losartan Other (See Comments)    Weird dreams,  ED, swelling    Family History: Family History  Problem Relation Age of Onset  . Cancer Other   . Breast cancer Mother   . Heart attack Father   . Lung cancer Father   . Kidney disease Neg Hx   . Prostate cancer Neg Hx     Social History:  reports that he has never smoked. He has never used smokeless tobacco. He reports that he drinks alcohol. He reports that he does not use illicit drugs.  ROS: UROLOGY Frequent Urination?: No Hard to postpone urination?: No Burning/pain with urination?: No Get up at night to urinate?: No Leakage of urine?: No Urine stream starts and stops?: No Trouble starting stream?: No Do you have to strain to urinate?: No Blood in urine?: No Urinary tract infection?: No Sexually transmitted disease?: No Injury to kidneys or bladder?: No Painful intercourse?: No Weak stream?: No Erection problems?: No Penile pain?: No  Gastrointestinal Nausea?:  No Vomiting?: No Indigestion/heartburn?: No Diarrhea?: No Constipation?: No  Constitutional Fever: No Night sweats?: No Weight loss?: No Fatigue?: No  Skin Skin rash/lesions?: No Itching?: No  Eyes Blurred vision?: No Double vision?: No  Ears/Nose/Throat Sore throat?: No Sinus problems?: No  Hematologic/Lymphatic Swollen glands?: No Easy bruising?: No  Cardiovascular Leg swelling?: No Chest pain?: No  Respiratory Cough?: No Shortness of breath?: No  Endocrine Excessive thirst?: No  Musculoskeletal Back pain?: No Joint pain?: No  Neurological Headaches?: No Dizziness?: No  Psychologic Depression?: No Anxiety?: No  Physical Exam: BP 141/85 mmHg  Pulse 70  Ht 5\' 9"  (1.753 m)  Wt 207 lb (93.895 kg)  BMI 30.55 kg/m2  Constitutional: Well nourished. Alert and oriented, No acute distress. HEENT: [place] AT, moist mucus membranes. Trachea midline, no masses. Cardiovascular: No clubbing, cyanosis, or edema. Respiratory: Normal respiratory effort, no increased work of breathing. GI: Abdomen is soft, non tender, non distended, no abdominal masses. Liver and spleen not palpable.  No hernias appreciated.  Stool sample for occult testing is not indicated.   GU: No CVA tenderness.  No bladder fullness or masses.   Skin: No rashes, bruises or suspicious lesions. Lymph: No cervical or inguinal adenopathy. Neurologic: Grossly intact, no focal deficits, moving all 4 extremities. Psychiatric: Normal mood and affect.  Laboratory Data: Lab Results  Component Value Date   WBC 4.6 02/20/2016   HGB 15.6 01/17/2015   HCT 43.9 02/20/2016   MCV 90 02/20/2016   PLT 264 02/20/2016    Lab Results  Component Value Date   CREATININE 1.24 02/20/2016    Lab Results  Component Value Date   PSA 1.0 01/17/2015   PSA  1.0 ng/mL on 02/20/2016  Lab Results  Component Value Date   TSH 2.050 02/20/2016       Component Value Date/Time   CHOL 219* 02/20/2016 0950    CHOL 260* 01/17/2015   HDL 45 02/20/2016 0950   HDL 38 01/17/2015   CHOLHDL 4.9 02/20/2016 0950   LDLCALC 126* 02/20/2016 0950   LDLCALC 114 01/17/2015    Lab Results  Component Value Date   AST 23 02/20/2016   Lab Results  Component Value Date   ALT 23 02/20/2016     Urinalysis Results for orders placed or performed in visit on 05/29/16  Microscopic Examination  Result Value Ref Range   WBC, UA None seen 0 -  5 /hpf   RBC, UA 0-2 0 -  2 /hpf   Epithelial Cells (non renal) 0-10 0 - 10 /hpf  Bacteria, UA None seen None seen/Few  Urinalysis, Complete  Result Value Ref Range   Specific Gravity, UA 1.025 1.005 - 1.030   pH, UA 5.5 5.0 - 7.5   Color, UA Yellow Yellow   Appearance Ur Clear Clear   Leukocytes, UA Negative Negative   Protein, UA Negative Negative/Trace   Glucose, UA Negative Negative   Ketones, UA Negative Negative   RBC, UA Negative Negative   Bilirubin, UA Negative Negative   Urobilinogen, Ur 0.2 0.2 - 1.0 mg/dL   Nitrite, UA Negative Negative   Microscopic Examination See below:     Pertinent Imaging: CLINICAL DATA: Followup kidney stones  EXAM: RENAL / URINARY TRACT ULTRASOUND COMPLETE  COMPARISON: 03/06/2015  FINDINGS: Right Kidney:  Length: 10.6 cm. Echogenicity within normal limits. No mass or hydronephrosis visualized.  Left Kidney:  Length: 12.4 cm. Echogenicity within normal limits. No mass or hydronephrosis visualized.  Bladder:  Appears normal for degree of bladder distention.  IMPRESSION: 1. Normal renal sonogram.   Electronically Signed  By: Signa Kellaylor Stroud M.D.  On: 05/20/2016 15:18  Assessment & Plan:    1. S/p left ESWL:    Doing well.  Treated stone is not visible on last month's KUB.   RUS from 05/20/2016 did not demonstrate any hydronephrosis.    2. Kidney stones:   Patient has remaining stone in the lower pole of his left kidney.  It was not noted on the RUS from 05/20/2016.  3. Microscopic  hematuria:    We will continue to monitor the patient's UA  to ensure the hematuria has resolved.  If hematuria persists, we will pursue a hematuria workup with CT Urogram and cystoscopy if appropriate.  - Urinalysis, Complete - DG Abd 1 View; Future   Return in about 9 months (around 02/26/2017) for KUB, UA, IPSS, PSA and exam.  These notes generated with voice recognition software. I apologize for typographical errors.  Michiel CowboySHANNON Areonna Bran, PA-C  [facility] [address], [address] [place], [place] [address] [phone]

## 2016-06-10 ENCOUNTER — Ambulatory Visit (INDEPENDENT_AMBULATORY_CARE_PROVIDER_SITE_OTHER): Payer: BLUE CROSS/BLUE SHIELD | Admitting: Family Medicine

## 2016-06-10 VITALS — BP 108/60 | HR 64 | Temp 98.1°F | Resp 16 | Wt 205.0 lb

## 2016-06-10 DIAGNOSIS — R0683 Snoring: Secondary | ICD-10-CM | POA: Insufficient documentation

## 2016-06-10 DIAGNOSIS — N200 Calculus of kidney: Secondary | ICD-10-CM

## 2016-06-10 NOTE — Progress Notes (Signed)
Roger Chang.  MRN: [id number] DOB: 03/31/1961  Subjective:  HPI    The patient is a 55 year old male who presents for follow up after evaluations for hematuria and snoring.   He was seen by Dr. Sherryl BartersBudzyn for his hematuria.  He states that he found it to be from 2 kidney stones.  Per the patient he had lithotripsy on one of the stones.  The other stone is in a position that will not allow lithotripsy to be done.  The patient states he was told they will follow this and as long as he does not have any further issues with it they will follow up in 1 year.  Dr. Sherryl BartersBudzyn states that it may never be passed or even move from where it is.  The patient went to see Dr. Irving ShowsJuengal for his snoring.  He was told that based on his prior sleep study Dr. Irving ShowsJuengal did not think he had sleep apnea.  He was put on a nasal spray but the patient states it did not help.  He has not been back to see Dr. Irving ShowsJuengal because he wanted to get finished with the kidney stones first.  He is now ready to follow back with Dr. Irving ShowsJuengal.  The patient's only complaint today is that of night sweats.  He states it does not happen every night but most.  It does not last but for a few minutes and then he is ok.  He states he will wake up hot and sweaty, he will uncover until cooled off and then he is able to go back to sleep.  It is not usually associated with nocturia or dreaming, but on some occasions it may.   Patient Active Problem List   Diagnosis Date Noted  . Kidney stones 05/01/2016  . Microscopic hematuria 05/01/2016  . Ureteral stone 05/01/2016  . Failure of erection 06/21/2015  . Brash 06/21/2015  . Essential (primary) hypertension 06/21/2015  . HLD (hyperlipidemia) 06/21/2015  . Hypertrophy of salivary gland 06/21/2015  . Restless leg 06/21/2015  . Avitaminosis D 06/21/2015  . Essential hypertension 05/28/2009  . DYSPNEA 05/28/2009    Past Medical History  Diagnosis Date  . Hypertension     Unspecified  . Dyspnea     . Microscopic hematuria 03/2016    Social History   Social History  . Marital Status: Married    Spouse Name: N/A  . Number of Children: N/A  . Years of Education: N/A   Occupational History  . Full time    Social History Main Topics  . Smoking status: Never Smoker   . Smokeless tobacco: Never Used  . Alcohol Use: Yes     Comment: occasionally  . Drug Use: No  . Sexual Activity: Not on file   Other Topics Concern  . Not on file   Social History Narrative   Pt gets regular exercise.    Outpatient Prescriptions Prior to Visit  Medication Sig Dispense Refill  . aspirin 81 MG tablet Take by mouth.    . cholecalciferol (VITAMIN D) 1000 UNITS tablet Take by mouth.    . diltiazem (CARDIZEM CD) 180 MG 24 hr capsule   1  . labetalol (NORMODYNE) 200 MG tablet Take by mouth.    . meloxicam (MOBIC) 15 MG tablet Reported on 04/25/2016  0  . pravastatin (PRAVACHOL) 40 MG tablet Take 1 tablet (40 mg total) by mouth daily. 30 tablet 12  . ranitidine (ZANTAC) 150 MG capsule Take  1 capsule (150 mg total) by mouth 2 (two) times daily. 60 capsule 12  . rOPINIRole (REQUIP) 2 MG tablet take 1/2 to 1 tablet by mouth at bedtime 30 tablet 12  . tadalafil (CIALIS) 20 MG tablet 1 tablet every other day 10 tablet 12   No facility-administered medications prior to visit.    Allergies  Allergen Reactions  . Lisinopril Swelling    Swelling of the lips and dry mouth  . Amlodipine Swelling    Swelling in extremeties more than mild, pt was not comfortable with that  . Hydrochlorothiazide Swelling    Swelling of the foot, possibly contributed to developing gout, felt dehydrated, B/P not controlled  . Losartan Other (See Comments)    Weird dreams, ED, swelling    Review of Systems  Constitutional: Negative for fever and malaise/fatigue.  Respiratory: Negative for cough, shortness of breath and wheezing.   Cardiovascular: Negative for chest pain, palpitations, orthopnea, claudication, leg  swelling and PND.  Gastrointestinal: Negative for heartburn, nausea, vomiting, abdominal pain, diarrhea, constipation, blood in stool and melena.  Genitourinary: Negative for dysuria, urgency, frequency, hematuria and flank pain.  Neurological: Negative for dizziness, weakness and headaches.   Objective:  BP 108/60 mmHg  Pulse 64  Temp(Src) 98.1 F (36.7 C) (Oral)  Resp 16  Wt 205 lb (92.987 kg)  Physical Exam  Constitutional: He is oriented to person, place, and time and well-developed, well-nourished, and in no distress.  HENT:  Head: Normocephalic.  Eyes: Pupils are equal, round, and reactive to light.  Neck: Normal range of motion.  Cardiovascular: Normal rate, regular rhythm and normal heart sounds.   Pulmonary/Chest: Effort normal and breath sounds normal.  Neurological: He is alert and oriented to person, place, and time. Gait normal.  Skin: Skin is warm and dry.  Psychiatric: Mood, memory, affect and judgment normal.    Assessment and Plan :   1. Kidney stones Stable.  Follow up with Dr. Sherryl Barters in 1 year or if needed before.  2. Snoring Will follow back up with Dr. Irving Shows. 3. Hypertension/palpitations Well-controlled. 4. Restless leg syndrome Well-controlled. Return to clinic 6 months.    Patient was seen and examined by Dr. Gerlene Burdock L. Wendelyn Breslow and the note was scribed by Janey Greaser, RMA. I have done the exam and reviewed the above chart and it is accurate to the best of my knowledge.  Julieanne Manson MD [facility] 06/10/2016 8:27 AM

## 2016-07-25 NOTE — Telephone Encounter (Signed)
error 

## 2016-07-29 DIAGNOSIS — M7662 Achilles tendinitis, left leg: Secondary | ICD-10-CM | POA: Diagnosis not present

## 2016-07-31 DIAGNOSIS — M7732 Calcaneal spur, left foot: Secondary | ICD-10-CM | POA: Diagnosis not present

## 2016-08-01 ENCOUNTER — Other Ambulatory Visit: Payer: Self-pay | Admitting: Family Medicine

## 2016-08-01 DIAGNOSIS — E785 Hyperlipidemia, unspecified: Secondary | ICD-10-CM

## 2016-08-04 DIAGNOSIS — M7732 Calcaneal spur, left foot: Secondary | ICD-10-CM | POA: Diagnosis not present

## 2016-08-07 DIAGNOSIS — M7732 Calcaneal spur, left foot: Secondary | ICD-10-CM | POA: Diagnosis not present

## 2016-08-11 DIAGNOSIS — M7732 Calcaneal spur, left foot: Secondary | ICD-10-CM | POA: Diagnosis not present

## 2016-08-14 DIAGNOSIS — M7732 Calcaneal spur, left foot: Secondary | ICD-10-CM | POA: Diagnosis not present

## 2016-08-19 DIAGNOSIS — M7732 Calcaneal spur, left foot: Secondary | ICD-10-CM | POA: Diagnosis not present

## 2016-08-21 DIAGNOSIS — G4733 Obstructive sleep apnea (adult) (pediatric): Secondary | ICD-10-CM | POA: Diagnosis not present

## 2016-08-21 DIAGNOSIS — M7732 Calcaneal spur, left foot: Secondary | ICD-10-CM | POA: Diagnosis not present

## 2016-08-21 DIAGNOSIS — R0683 Snoring: Secondary | ICD-10-CM | POA: Diagnosis not present

## 2016-08-28 DIAGNOSIS — M7732 Calcaneal spur, left foot: Secondary | ICD-10-CM | POA: Diagnosis not present

## 2016-09-04 DIAGNOSIS — M7732 Calcaneal spur, left foot: Secondary | ICD-10-CM | POA: Diagnosis not present

## 2016-09-08 ENCOUNTER — Other Ambulatory Visit: Payer: Self-pay | Admitting: Family Medicine

## 2016-09-09 ENCOUNTER — Telehealth: Payer: Self-pay | Admitting: Family Medicine

## 2016-09-09 NOTE — Telephone Encounter (Signed)
Pt called back and ask for Roger Chang,  Not avail at the time.  Please call back.

## 2016-09-09 NOTE — Telephone Encounter (Signed)
Please review-aa 

## 2016-09-09 NOTE — Telephone Encounter (Signed)
It is possible that the ropinirole for the RLS is making him tired during the day. I am finecalling in a sleeping  pill for him but it is possible he might want to stop the ropinirole and see after a week or 2 how he feels during the day. We made that didn't need to do something else for the RLS if he feels better.

## 2016-09-09 NOTE — Telephone Encounter (Signed)
Spoke with patient and advised as below. He states taht Requip is helping RLS it seems and maybe not to change it since its helping. Can he try a medication for sleep for daily use or as needed basis? Whichever you think is best for him-aa

## 2016-09-09 NOTE — Telephone Encounter (Signed)
Pt stated he is having trouble sleeping and feeling very tired during the day. [facility] [place] to help him sleep. Please advise. Thanks TNP

## 2016-09-09 NOTE — Telephone Encounter (Signed)
[id number]-aa 

## 2016-09-09 NOTE — Telephone Encounter (Signed)
Patient states no he has not tried that before-aa

## 2016-09-09 NOTE — Telephone Encounter (Signed)
Has he ever tried zolpidem 10 mg daily at bedtime when necessary?

## 2016-09-10 NOTE — Telephone Encounter (Signed)
Please let me know about the RX thank you=-aa

## 2016-09-10 NOTE — Telephone Encounter (Signed)
Pt is called to see if a Rx has ben sent in to help him sleep?  WU#[phone]/NFCB#[phone]/MW

## 2016-09-11 DIAGNOSIS — M7732 Calcaneal spur, left foot: Secondary | ICD-10-CM | POA: Diagnosis not present

## 2016-09-11 MED ORDER — ZOLPIDEM TARTRATE 10 MG PO TABS: 10.0000 mg | ORAL_TABLET | Freq: Every evening | ORAL | 5 refills | Status: DC | PRN

## 2016-09-11 NOTE — Telephone Encounter (Signed)
Pt advised on voicemail that RX called in-aa

## 2016-09-11 NOTE — Telephone Encounter (Signed)
Let's try the zolpidem 10 mg daily at bedtime.30,5rf

## 2016-09-18 DIAGNOSIS — M7732 Calcaneal spur, left foot: Secondary | ICD-10-CM | POA: Diagnosis not present

## 2016-09-25 DIAGNOSIS — M7732 Calcaneal spur, left foot: Secondary | ICD-10-CM | POA: Diagnosis not present

## 2016-10-01 ENCOUNTER — Ambulatory Visit: Payer: BLUE CROSS/BLUE SHIELD | Attending: Otolaryngology

## 2016-10-01 DIAGNOSIS — G4733 Obstructive sleep apnea (adult) (pediatric): Secondary | ICD-10-CM | POA: Diagnosis not present

## 2016-10-01 DIAGNOSIS — R0683 Snoring: Secondary | ICD-10-CM | POA: Diagnosis not present

## 2016-10-02 DIAGNOSIS — M7732 Calcaneal spur, left foot: Secondary | ICD-10-CM | POA: Diagnosis not present

## 2016-10-09 DIAGNOSIS — M7732 Calcaneal spur, left foot: Secondary | ICD-10-CM | POA: Diagnosis not present

## 2016-10-13 DIAGNOSIS — G4733 Obstructive sleep apnea (adult) (pediatric): Secondary | ICD-10-CM | POA: Diagnosis not present

## 2016-10-14 DIAGNOSIS — R0602 Shortness of breath: Secondary | ICD-10-CM | POA: Diagnosis not present

## 2016-10-14 DIAGNOSIS — E782 Mixed hyperlipidemia: Secondary | ICD-10-CM | POA: Diagnosis not present

## 2016-10-14 DIAGNOSIS — G4733 Obstructive sleep apnea (adult) (pediatric): Secondary | ICD-10-CM | POA: Diagnosis not present

## 2016-10-14 DIAGNOSIS — G4730 Sleep apnea, unspecified: Secondary | ICD-10-CM | POA: Insufficient documentation

## 2016-10-14 DIAGNOSIS — I10 Essential (primary) hypertension: Secondary | ICD-10-CM | POA: Diagnosis not present

## 2016-10-16 DIAGNOSIS — M7732 Calcaneal spur, left foot: Secondary | ICD-10-CM | POA: Diagnosis not present

## 2016-10-22 DIAGNOSIS — G4733 Obstructive sleep apnea (adult) (pediatric): Secondary | ICD-10-CM | POA: Diagnosis not present

## 2016-10-23 DIAGNOSIS — M7732 Calcaneal spur, left foot: Secondary | ICD-10-CM | POA: Diagnosis not present

## 2016-10-28 ENCOUNTER — Other Ambulatory Visit: Payer: Self-pay | Admitting: Emergency Medicine

## 2016-10-28 DIAGNOSIS — Z789 Other specified health status: Secondary | ICD-10-CM

## 2016-10-28 MED ORDER — AZITHROMYCIN 250 MG PO TABS: ORAL_TABLET | ORAL | 0 refills | Status: DC

## 2016-10-30 DIAGNOSIS — M7732 Calcaneal spur, left foot: Secondary | ICD-10-CM | POA: Diagnosis not present

## 2016-11-06 DIAGNOSIS — M7732 Calcaneal spur, left foot: Secondary | ICD-10-CM | POA: Diagnosis not present

## 2016-11-20 DIAGNOSIS — M7732 Calcaneal spur, left foot: Secondary | ICD-10-CM | POA: Diagnosis not present

## 2016-11-21 DIAGNOSIS — G4733 Obstructive sleep apnea (adult) (pediatric): Secondary | ICD-10-CM | POA: Diagnosis not present

## 2016-12-04 ENCOUNTER — Other Ambulatory Visit: Payer: Self-pay | Admitting: Family Medicine

## 2016-12-10 DIAGNOSIS — M7732 Calcaneal spur, left foot: Secondary | ICD-10-CM | POA: Diagnosis not present

## 2016-12-22 DIAGNOSIS — G4733 Obstructive sleep apnea (adult) (pediatric): Secondary | ICD-10-CM | POA: Diagnosis not present

## 2016-12-31 DIAGNOSIS — G4733 Obstructive sleep apnea (adult) (pediatric): Secondary | ICD-10-CM | POA: Diagnosis not present

## 2016-12-31 DIAGNOSIS — M7732 Calcaneal spur, left foot: Secondary | ICD-10-CM | POA: Diagnosis not present

## 2017-01-22 DIAGNOSIS — G4733 Obstructive sleep apnea (adult) (pediatric): Secondary | ICD-10-CM | POA: Diagnosis not present

## 2017-02-04 DIAGNOSIS — M7662 Achilles tendinitis, left leg: Secondary | ICD-10-CM | POA: Diagnosis not present

## 2017-02-18 ENCOUNTER — Other Ambulatory Visit: Payer: Self-pay

## 2017-02-18 DIAGNOSIS — Z125 Encounter for screening for malignant neoplasm of prostate: Secondary | ICD-10-CM

## 2017-02-19 ENCOUNTER — Ambulatory Visit
Admission: RE | Admit: 2017-02-19 | Discharge: 2017-02-19 | Disposition: A | Discharge status: Home / Self Care | Payer: BLUE CROSS/BLUE SHIELD | Source: Ambulatory Visit | Attending: Urology | Admitting: Urology

## 2017-02-19 ENCOUNTER — Other Ambulatory Visit: Payer: BLUE CROSS/BLUE SHIELD

## 2017-02-19 DIAGNOSIS — Z87442 Personal history of urinary calculi: Secondary | ICD-10-CM | POA: Diagnosis not present

## 2017-02-19 DIAGNOSIS — N200 Calculus of kidney: Secondary | ICD-10-CM

## 2017-02-19 DIAGNOSIS — Z125 Encounter for screening for malignant neoplasm of prostate: Secondary | ICD-10-CM

## 2017-02-20 LAB — PSA: PROSTATE SPECIFIC AG, SERUM: 0.8 ng/mL (ref 0.0–4.0)

## 2017-02-24 ENCOUNTER — Ambulatory Visit (INDEPENDENT_AMBULATORY_CARE_PROVIDER_SITE_OTHER): Payer: BLUE CROSS/BLUE SHIELD | Admitting: Family Medicine

## 2017-02-24 ENCOUNTER — Encounter: Payer: Self-pay | Admitting: Family Medicine

## 2017-02-24 VITALS — BP 108/68 | HR 74 | Temp 97.6°F | Resp 16 | Ht 69.0 in | Wt 204.0 lb

## 2017-02-24 DIAGNOSIS — Z0000 Encounter for general adult medical examination without abnormal findings: Secondary | ICD-10-CM | POA: Diagnosis not present

## 2017-02-24 DIAGNOSIS — E7800 Pure hypercholesterolemia, unspecified: Secondary | ICD-10-CM | POA: Diagnosis not present

## 2017-02-24 DIAGNOSIS — R739 Hyperglycemia, unspecified: Secondary | ICD-10-CM | POA: Diagnosis not present

## 2017-02-24 DIAGNOSIS — Z1211 Encounter for screening for malignant neoplasm of colon: Secondary | ICD-10-CM | POA: Diagnosis not present

## 2017-02-24 DIAGNOSIS — K219 Gastro-esophageal reflux disease without esophagitis: Secondary | ICD-10-CM | POA: Diagnosis not present

## 2017-02-24 LAB — POCT URINALYSIS DIPSTICK
Bilirubin, UA: NEGATIVE
Glucose, UA: NEGATIVE
KETONES UA: NEGATIVE
LEUKOCYTES UA: NEGATIVE
Nitrite, UA: NEGATIVE
PH UA: 5
PROTEIN UA: NEGATIVE
RBC UA: NEGATIVE
SPEC GRAV UA: 1.025
Urobilinogen, UA: 0.2

## 2017-02-24 LAB — IFOBT (OCCULT BLOOD): IFOBT: NEGATIVE

## 2017-02-24 MED ORDER — DILTIAZEM HCL ER COATED BEADS 180 MG PO CP24: 180.0000 mg | ORAL_CAPSULE | Freq: Every day | ORAL | 12 refills | Status: DC

## 2017-02-24 MED ORDER — LABETALOL HCL 200 MG PO TABS: 200.0000 mg | ORAL_TABLET | Freq: Every day | ORAL | 12 refills | Status: DC

## 2017-02-24 MED ORDER — RANITIDINE HCL 150 MG PO CAPS: 150.0000 mg | ORAL_CAPSULE | Freq: Two times a day (BID) | ORAL | 12 refills | Status: DC

## 2017-02-24 MED ORDER — TADALAFIL 20 MG PO TABS: ORAL_TABLET | ORAL | 12 refills | Status: DC

## 2017-02-24 MED ORDER — PRAVASTATIN SODIUM 40 MG PO TABS: 40.0000 mg | ORAL_TABLET | Freq: Every day | ORAL | 12 refills | Status: DC

## 2017-02-24 MED ORDER — ROPINIROLE HCL 2 MG PO TABS: ORAL_TABLET | ORAL | 12 refills | Status: DC

## 2017-02-24 MED ORDER — ZOLPIDEM TARTRATE 10 MG PO TABS: 10.0000 mg | ORAL_TABLET | Freq: Every evening | ORAL | 5 refills | Status: DC | PRN

## 2017-02-24 NOTE — Progress Notes (Signed)
Patient: Roger Eckardt., Male    DOB: May 17, 1961, 56 y.o.   MRN: [id number] Visit Date: 02/24/2017  Today's Provider: Megan Mans, MD   Chief Complaint  Patient presents with  . Annual Exam   Subjective:    Annual physical exam Roger Chang. is a 56 y.o. male who presents today for health maintenance and complete physical. He feels well. He reports exercising 3-4 times a week. He reports he is sleeping fairly well, he is still getting used to his CPAP machine. He complains of dry mouth. It occurs most of the time. He has tried Biotene and has helped some. He was unsure if it was his medications he was taking or something else.  ----------------------------------------------------------------- Colonoscopy- 09/26/09 Diverticulosis repeat 10 years  Immunization History  Administered Date(s) Administered  . Influenza-Unspecified 01/29/2017  . Tdap 08/13/2007     Review of Systems  Constitutional: Negative.   HENT: Negative.        Dry mouth  Eyes: Negative.   Respiratory: Positive for apnea.   Cardiovascular: Negative.   Gastrointestinal: Negative.   Endocrine: Negative.   Genitourinary: Negative.   Musculoskeletal: Negative.   Skin: Negative.   Allergic/Immunologic: Negative.   Neurological: Negative.   Hematological: Negative.   Psychiatric/Behavioral: Negative.     Social History      He  reports that he has never smoked. He has never used smokeless tobacco. He reports that he drinks alcohol. He reports that he does not use drugs.       Social History   Social History  . Marital status: Married    Spouse name: N/A  . Number of children: N/A  . Years of education: N/A   Occupational History  . Full time    Social History Main Topics  . Smoking status: Never Smoker  . Smokeless tobacco: Never Used  . Alcohol use Yes     Comment: occasionally  . Drug use: No  . Sexual activity: Not Asked   Other Topics Concern  . None   Social History  Narrative   Pt gets regular exercise.    Past Medical History:  Diagnosis Date  . Dyspnea   . Hypertension    Unspecified  . Microscopic hematuria 03/2016     Patient Active Problem List   Diagnosis Date Noted  . Apnea, sleep 10/14/2016  . Snoring 06/10/2016  . Kidney stones 05/01/2016  . Microscopic hematuria 05/01/2016  . Ureteral stone 05/01/2016  . Failure of erection 06/21/2015  . Brash 06/21/2015  . Essential (primary) hypertension 06/21/2015  . HLD (hyperlipidemia) 06/21/2015  . Hypertrophy of salivary gland 06/21/2015  . Restless leg 06/21/2015  . Avitaminosis D 06/21/2015  . Essential hypertension 05/28/2009  . DYSPNEA 05/28/2009    Past Surgical History:  Procedure Laterality Date  . CHOLECYSTECTOMY    . EXTRACORPOREAL SHOCK WAVE LITHOTRIPSY Left 04/10/2016   Procedure: EXTRACORPOREAL SHOCK WAVE LITHOTRIPSY (ESWL);  Surgeon: Hildred Laser, MD;  Location: [facility] ORS;  Service: Urology;  Laterality: Left;  . HEMORRHOID SURGERY  2000  . LASIK    . VASECTOMY  1999    Family History        Family Status  Relation Status  . Mother Deceased at age 16  . Father Deceased at age 65  . Sister Deceased  . Brother Alive  . Sister Alive  . Sister Alive  . Sister Alive  . Brother Alive  . Son Alive  .  Son Alive  . Other   . Neg Hx         His family history includes Breast cancer in his mother; Cancer in his other; Heart attack in his father; Lung cancer in his father.     Allergies  Allergen Reactions  . Lisinopril Swelling    Swelling of the lips and dry mouth  . Amlodipine Swelling    Swelling in extremeties more than mild, pt was not comfortable with that  . Hydrochlorothiazide Swelling    Swelling of the foot, possibly contributed to developing gout, felt dehydrated, B/P not controlled  . Losartan Other (See Comments)    Weird dreams, ED, swelling     Current Outpatient Prescriptions:  .  aspirin 81 MG tablet, Take by mouth., Disp: , Rfl:  .   cholecalciferol (VITAMIN D) 1000 UNITS tablet, Take by mouth., Disp: , Rfl:  .  diltiazem (CARDIZEM CD) 180 MG 24 hr capsule, TAKE 1 CAPSULE BY MOUTH ONCE DAILY., Disp: 30 capsule, Rfl: 12 .  pravastatin (PRAVACHOL) 40 MG tablet, TAKE 1 TABLET BY MOUTH ONCE DAILY., Disp: 30 tablet, Rfl: 12 .  ranitidine (ZANTAC) 150 MG capsule, Take 1 capsule (150 mg total) by mouth 2 (two) times daily., Disp: 60 capsule, Rfl: 12 .  rOPINIRole (REQUIP) 2 MG tablet, TAKE 1/2-1 TABLETS BY MOUTH AT BEDTIME., Disp: 30 tablet, Rfl: 12 .  tadalafil (CIALIS) 20 MG tablet, 1 tablet every other day, Disp: 10 tablet, Rfl: 12 .  labetalol (NORMODYNE) 200 MG tablet, Take by mouth., Disp: , Rfl:  .  meloxicam (MOBIC) 15 MG tablet, Reported on 04/25/2016, Disp: , Rfl: 0 .  zolpidem (AMBIEN) 10 MG tablet, Take 1 tablet (10 mg total) by mouth at bedtime as needed for sleep., Disp: 30 tablet, Rfl: 5   Patient Care Team: Maple Hudson., MD as PCP - General (Family Medicine)      Objective:   Vitals: BP 108/68 (BP Location: Left Arm, Patient Position: Sitting, Cuff Size: Large)   Pulse 74   Temp 97.6 F (36.4 C) (Oral)   Resp 16   Ht 5\' 9"  (1.753 m)   Wt 204 lb (92.5 kg)   BMI 30.13 kg/m    Vitals:   02/24/17 0839  BP: 108/68  Pulse: 74  Resp: 16  Temp: 97.6 F (36.4 C)  TempSrc: Oral  Weight: 204 lb (92.5 kg)  Height: 5\' 9"  (1.753 m)     Physical Exam  Constitutional: He is oriented to person, place, and time. He appears well-developed and well-nourished.  HENT:  Head: Normocephalic and atraumatic.  Right Ear: External ear normal.  Left Ear: External ear normal.  Nose: Nose normal.  Mouth/Throat: Oropharynx is clear and moist.  Eyes: Conjunctivae and EOM are normal. Pupils are equal, round, and reactive to light.  Neck: Normal range of motion. Neck supple.  Cardiovascular: Normal rate, regular rhythm, normal heart sounds and intact distal pulses.   Pulmonary/Chest: Effort normal and breath sounds  normal.  Abdominal: Soft. Bowel sounds are normal.  Genitourinary: Rectum normal, prostate normal and penis normal.  Musculoskeletal: Normal range of motion.  Neurological: He is alert and oriented to person, place, and time. He has normal reflexes.  Skin: Skin is warm and dry.  Psychiatric: He has a normal mood and affect. His behavior is normal. Judgment and thought content normal.     Depression Screen PHQ 2/9 Scores 02/24/2017 08/15/2015  PHQ - 2 Score 0 0  PHQ- 9 Score  0 -      Assessment & Plan:     Routine Health Maintenance and Physical Exam  Exercise Activities and Dietary recommendations Goals    None      Immunization History  Administered Date(s) Administered  . Influenza-Unspecified 01/29/2017  . Tdap 08/13/2007    Health Maintenance  Topic Date Due  . Hepatitis C Screening  [birth year]  . HIV Screening  07/26/1974  . INFLUENZA VACCINE  07/27/2017 (Originally 07/22/2016)  . TETANUS/TDAP  08/12/2017  . COLONOSCOPY  09/27/2019    RTC 1 year. Discussed health benefits of physical activity, and encouraged him to engage in regular exercise appropriate for his age and condition.  RLS HLD HTN GERD Mild OSA Work on weight loss.Try bite block again.   --------------------------------------------------------------------   I have done the exam and reviewed the above chart and it is accurate to the best of my knowledge. [employer]ist[employer]agon  technology has been used in this note in any air is in the dictation or transcription are unintentional.  Roger Mansichard Gilbert Jr, MD  [facility]

## 2017-02-25 LAB — LIPID PANEL WITH LDL/HDL RATIO
CHOLESTEROL TOTAL: 247 mg/dL — AB (ref 100–199)
HDL: 48 mg/dL (ref >39)
LDL CALC: 138 mg/dL — AB (ref 0–99)
LDL/HDL RATIO: 2.9 ratio (ref 0.0–3.6)
Triglycerides: 304 mg/dL — ABNORMAL HIGH (ref 0–149)
VLDL CHOLESTEROL CAL: 61 mg/dL — AB (ref 5–40)

## 2017-02-25 LAB — CBC WITH DIFFERENTIAL/PLATELET
BASOS ABS: 0 10*3/uL (ref 0.0–0.2)
Basos: 1 %
EOS (ABSOLUTE): 0.1 10*3/uL (ref 0.0–0.4)
EOS: 3 %
HEMATOCRIT: 44.9 % (ref 37.5–51.0)
HEMOGLOBIN: 15.2 g/dL (ref 13.0–17.7)
IMMATURE GRANS (ABS): 0 10*3/uL (ref 0.0–0.1)
IMMATURE GRANULOCYTES: 0 %
LYMPHS: 45 %
Lymphocytes Absolute: 1.8 10*3/uL (ref 0.7–3.1)
MCH: 30.2 pg (ref 26.6–33.0)
MCHC: 33.9 g/dL (ref 31.5–35.7)
MCV: 89 fL (ref 79–97)
MONOCYTES: 12 %
Monocytes Absolute: 0.5 10*3/uL (ref 0.1–0.9)
Neutrophils Absolute: 1.6 10*3/uL (ref 1.4–7.0)
Neutrophils: 39 %
Platelets: 284 10*3/uL (ref 150–379)
RBC: 5.04 x10E6/uL (ref 4.14–5.80)
RDW: 14.2 % (ref 12.3–15.4)
WBC: 4.1 10*3/uL (ref 3.4–10.8)

## 2017-02-25 LAB — COMPREHENSIVE METABOLIC PANEL
A/G RATIO: 2 (ref 1.2–2.2)
ALBUMIN: 4.8 g/dL (ref 3.5–5.5)
ALT: 29 IU/L (ref 0–44)
AST: 29 IU/L (ref 0–40)
Alkaline Phosphatase: 46 IU/L (ref 39–117)
BUN / CREAT RATIO: 14 (ref 9–20)
BUN: 16 mg/dL (ref 6–24)
Bilirubin Total: 0.4 mg/dL (ref 0.0–1.2)
CALCIUM: 10 mg/dL (ref 8.7–10.2)
CO2: 25 mmol/L (ref 18–29)
Chloride: 99 mmol/L (ref 96–106)
Creatinine, Ser: 1.14 mg/dL (ref 0.76–1.27)
GFR, EST AFRICAN AMERICAN: 82 mL/min/{1.73_m2} (ref >59)
GFR, EST NON AFRICAN AMERICAN: 71 mL/min/{1.73_m2} (ref >59)
GLOBULIN, TOTAL: 2.4 g/dL (ref 1.5–4.5)
Glucose: 101 mg/dL — ABNORMAL HIGH (ref 65–99)
POTASSIUM: 4.3 mmol/L (ref 3.5–5.2)
Sodium: 141 mmol/L (ref 134–144)
TOTAL PROTEIN: 7.2 g/dL (ref 6.0–8.5)

## 2017-02-25 LAB — TSH: TSH: 2.06 u[IU]/mL (ref 0.450–4.500)

## 2017-02-25 LAB — HEMOGLOBIN A1C
ESTIMATED AVERAGE GLUCOSE: 120 mg/dL
Hgb A1c MFr Bld: 5.8 % — ABNORMAL HIGH (ref 4.8–5.6)

## 2017-02-25 NOTE — Progress Notes (Signed)
9:05 AM   Roger Chang. [birth year] [id number]  Referring provider: Maple Hudson., MD [address] [place], [place] [address]  Chief Complaint  Patient presents with  . Follow-up    kidney stone 9 month follow up and Results KUB    HPI: 56 yo AAM with a history of left renal calculi, AMH, ED and BPH with LU TS who presents today for a yearly follow up.    History of left renal calculi Patient is status post ESWL for a 5 mm UPJ stone with Dr. Sherryl Barters on 04/10/2016.  KUB taken on 04/25/2016 noted a 6 mm stone over the lower pole of the left kidney.  RUS completed on 05/20/2016 was normal.  KUB taken on 02/19/2017 did not demonstrate any urinary stones.  I have independently reviewed the films. Stone composition is unknown.  Patient has not had any passage of stone fragments, gross hematuria or flank pain since his last visit with [place] almost 1 year ago.  History of hematuria CT Urogram completed on 03/05/2016 noted 3 calculi within the left renal collecting system. The largest calculus measuring 5 mm is positioned within the renal pelvis with the patient supine, although drops into the UPJ with the patient prone. This may be intermittently obstructing.  No evidence of renal mass or urothelial lesion. Cystoscopy completed on  03/12/2016 was negative.  ESWL completed.  Stone free at this time.  Does not report any gross hematuria.  His UA today was negative for AMH.    BPH WITH LUTS His IPSS score today is 1 which is mild lower urinary tract symptomatology. He is delighted with his quality life due to his urinary symptoms.   His major complaint today is nocturia x 1.  He has had these symptoms for the last few years.  He denies any dysuria, hematuria or suprapubic pain.   He also denies any recent fevers, chills, nausea or vomiting.  He does not have a family history of PCa.     IPSS    Row Name 02/26/17 0800         International Prostate Symptom Score   How  often have you had the sensation of not emptying your bladder? Not at All     How often have you had to urinate less than every two hours? Not at All     How often have you found you stopped and started again several times when you urinated? Not at All     How often have you found it difficult to postpone urination? Not at All     How often have you had a weak urinary stream? Not at All     How often have you had to strain to start urination? Not at All     How many times did you typically get up at night to urinate? 1 Time     Total IPSS Score 1       Quality of Life due to urinary symptoms   If you were to spend the rest of your life with your urinary condition just the way it is now how would you feel about that? Delighted        Score:  1-7 Mild 8-19 Moderate 20-35 Severe   Erectile dysfunction His SHIM score is 20, which is mild erectile dysfunction.   He has been having difficulty with erections off and one for the last two years.   His major complaint is  inconsistency with erections.  His libido is preserved.   His risk factors for ED are age, BPH, HTN, HLD, sleep apnea and blood pressure medications.  He denies any painful erections or curvatures with his erections.   He is still having spontaneous erections.  He has tried Cialis in the past., but he has not taken the medication in over one year.      SHIM    Row Name 02/26/17 0900         SHIM: Over the last 6 months:   How do you rate your confidence that you could get and keep an erection? High     When you had erections with sexual stimulation, how often were your erections hard enough for penetration (entering your partner)? Most Times (much more than half the time)     During sexual intercourse, how often were you able to maintain your erection after you had penetrated (entered) your partner? Slightly Difficult     During sexual intercourse, how difficult was it to maintain your erection to completion of intercourse?  Slightly Difficult     When you attempted sexual intercourse, how often was it satisfactory for you? Slightly Difficult       SHIM Total Score   SHIM 20        Score: 1-7 Severe ED 8-11 Moderate ED 12-16 Mild-Moderate ED 17-21 Mild ED 22-25 No ED   PMH: Past Medical History:  Diagnosis Date  . Dyspnea   . Hypertension    Unspecified  . Microscopic hematuria 03/2016    Surgical History: Past Surgical History:  Procedure Laterality Date  . CHOLECYSTECTOMY    . EXTRACORPOREAL SHOCK WAVE LITHOTRIPSY Left 04/10/2016   Procedure: EXTRACORPOREAL SHOCK WAVE LITHOTRIPSY (ESWL);  Surgeon: Hildred Laser, MD;  Location: [facility] ORS;  Service: Urology;  Laterality: Left;  . HEMORRHOID SURGERY  2000  . LASIK    . VASECTOMY  1999    Home Medications:  Allergies as of 02/26/2017      Reactions   Lisinopril Swelling   Swelling of the lips and dry mouth   Amlodipine Swelling   Swelling in extremeties more than mild, pt was not comfortable with that   Hydrochlorothiazide Swelling   Swelling of the foot, possibly contributed to developing gout, felt dehydrated, B/P not controlled   Losartan Other (See Comments)   Weird dreams, ED, swelling      Medication List       Accurate as of 02/26/17  9:05 AM. Always use your most recent med list.          aspirin 81 MG tablet Take by mouth.   cholecalciferol 1000 units tablet Commonly known as:  VITAMIN D Take by mouth.   diltiazem 180 MG 24 hr capsule Commonly known as:  CARDIZEM CD Take 1 capsule (180 mg total) by mouth daily.   labetalol 200 MG tablet Commonly known as:  NORMODYNE Take 1 tablet (200 mg total) by mouth daily.   meloxicam 15 MG tablet Commonly known as:  MOBIC Reported on 04/25/2016   pravastatin 40 MG tablet Commonly known as:  PRAVACHOL Take 1 tablet (40 mg total) by mouth daily.   ranitidine 150 MG capsule Commonly known as:  ZANTAC Take 1 capsule (150 mg total) by mouth 2 (two) times daily.     rOPINIRole 2 MG tablet Commonly known as:  REQUIP TAKE 1/2-1 TABLETS BY MOUTH AT BEDTIME.   tadalafil 20 MG tablet Commonly known as:  CIALIS 1 tablet  every other day   zolpidem 10 MG tablet Commonly known as:  AMBIEN Take 1 tablet (10 mg total) by mouth at bedtime as needed for sleep.       Allergies:  Allergies  Allergen Reactions  . Lisinopril Swelling    Swelling of the lips and dry mouth  . Amlodipine Swelling    Swelling in extremeties more than mild, pt was not comfortable with that  . Hydrochlorothiazide Swelling    Swelling of the foot, possibly contributed to developing gout, felt dehydrated, B/P not controlled  . Losartan Other (See Comments)    Weird dreams, ED, swelling    Family History: Family History  Problem Relation Age of Onset  . Breast cancer Mother   . Heart attack Father   . Lung cancer Father   . Cancer Other   . Kidney disease Neg Hx   . Prostate cancer Neg Hx   . Kidney cancer Neg Hx   . Bladder Cancer Neg Hx     Social History:  reports that he has never smoked. He has never used smokeless tobacco. He reports that he drinks alcohol. He reports that he does not use drugs.  ROS: UROLOGY Frequent Urination?: No Hard to postpone urination?: No Burning/pain with urination?: No Get up at night to urinate?: No Leakage of urine?: No Urine stream starts and stops?: No Trouble starting stream?: No Do you have to strain to urinate?: No Blood in urine?: No Urinary tract infection?: No Sexually transmitted disease?: No Injury to kidneys or bladder?: No Painful intercourse?: No Weak stream?: No Erection problems?: No Penile pain?: No  Gastrointestinal Nausea?: No Vomiting?: No Indigestion/heartburn?: No Diarrhea?: No Constipation?: No  Constitutional Fever: No Night sweats?: No Weight loss?: No Fatigue?: No  Skin Skin rash/lesions?: No Itching?: No  Eyes Blurred vision?: No Double vision?: No  Ears/Nose/Throat Sore  throat?: No Sinus problems?: No  Hematologic/Lymphatic Swollen glands?: No Easy bruising?: No  Cardiovascular Leg swelling?: No Chest pain?: No  Respiratory Cough?: No Shortness of breath?: No  Endocrine Excessive thirst?: No  Musculoskeletal Back pain?: No Joint pain?: No  Neurological Headaches?: No Dizziness?: No  Psychologic Depression?: No Anxiety?: No  Physical Exam: BP 124/75   Pulse 77   Ht 5\' 8"  (1.727 m)   Wt 204 lb 3.2 oz (92.6 kg)   BMI 31.05 kg/m   Constitutional: Well nourished. Alert and oriented, No acute distress. HEENT: [place] AT, moist mucus membranes. Trachea midline, no masses. Cardiovascular: No clubbing, cyanosis, or edema. Respiratory: Normal respiratory effort, no increased work of breathing. GI: Abdomen is soft, non tender, non distended, no abdominal masses. Liver and spleen not palpable.  No hernias appreciated.  Stool sample for occult testing is not indicated.   GU: No CVA tenderness.  No bladder fullness or masses.  Patient with circumcised phallus.   Urethral meatus is patent.  No penile discharge. No penile lesions or rashes. Scrotum without lesions, cysts, rashes and/or edema.  Testicles are located scrotally bilaterally. No masses are appreciated in the testicles. Left and right epididymis are normal. Rectal: Patient with  normal sphincter tone. Anus and perineum without scarring or rashes. No rectal masses are appreciated. Prostate is approximately 50 grams, no nodules are appreciated. Seminal vesicles are normal. Skin: No rashes, bruises or suspicious lesions. Lymph: No cervical or inguinal adenopathy. Neurologic: Grossly intact, no focal deficits, moving all 4 extremities. Psychiatric: Normal mood and affect.  Laboratory Data: Lab Results  Component Value Date   WBC 4.1  02/24/2017   HGB 15.6 01/17/2015   HCT 44.9 02/24/2017   MCV 89 02/24/2017   PLT 284 02/24/2017    Lab Results  Component Value Date   CREATININE 1.14  02/24/2017    Lab Results  Component Value Date   PSA 1.0 01/17/2015   PSA  1.0 ng/mL on 02/20/2016 PSA  0.8 ng/mL on 02/19/2017  Lab Results  Component Value Date   TSH 2.060 02/24/2017       Component Value Date/Time   CHOL 247 (H) 02/24/2017 0936   HDL 48 02/24/2017 0936   CHOLHDL 4.9 02/20/2016 0950   LDLCALC 138 (H) 02/24/2017 0936    Lab Results  Component Value Date   AST 29 02/24/2017   Lab Results  Component Value Date   ALT 29 02/24/2017    Results for orders placed or performed in visit on 02/24/17  CBC with Differential/Platelet  Result Value Ref Range   WBC 4.1 3.4 - 10.8 x10E3/uL   RBC 5.04 4.14 - 5.80 x10E6/uL   Hemoglobin 15.2 13.0 - 17.7 g/dL   Hematocrit 16.1 09.6 - 51.0 %   MCV 89 79 - 97 fL   MCH 30.2 26.6 - 33.0 pg   MCHC 33.9 31.5 - 35.7 g/dL   RDW 04.5 40.9 - 81.1 %   Platelets 284 150 - 379 x10E3/uL   Neutrophils 39 Not Estab. %   Lymphs 45 Not Estab. %   Monocytes 12 Not Estab. %   Eos 3 Not Estab. %   Basos 1 Not Estab. %   Neutrophils Absolute 1.6 1.4 - 7.0 x10E3/uL   Lymphocytes Absolute 1.8 0.7 - 3.1 x10E3/uL   Monocytes Absolute 0.5 0.1 - 0.9 x10E3/uL   EOS (ABSOLUTE) 0.1 0.0 - 0.4 x10E3/uL   Basophils Absolute 0.0 0.0 - 0.2 x10E3/uL   Immature Granulocytes 0 Not Estab. %   Immature Grans (Abs) 0.0 0.0 - 0.1 x10E3/uL  Lipid Panel With LDL/HDL Ratio  Result Value Ref Range   Cholesterol, Total 247 (H) 100 - 199 mg/dL   Triglycerides 914 (H) 0 - 149 mg/dL   HDL 48 >78 mg/dL   VLDL Cholesterol Cal 61 (H) 5 - 40 mg/dL   LDL Calculated 295 (H) 0 - 99 mg/dL   LDl/HDL Ratio 2.9 0.0 - 3.6 ratio units  TSH  Result Value Ref Range   TSH 2.060 0.450 - 4.500 uIU/mL  Hemoglobin A1c  Result Value Ref Range   Hgb A1c MFr Bld 5.8 (H) 4.8 - 5.6 %   Est. average glucose Bld gHb Est-mCnc 120 mg/dL  Comprehensive metabolic panel  Result Value Ref Range   Glucose 101 (H) 65 - 99 mg/dL   BUN 16 6 - 24 mg/dL   Creatinine, Ser 6.21 0.76  - 1.27 mg/dL   GFR calc non Af Amer 71 >59 mL/min/1.73   GFR calc Af Amer 82 >59 mL/min/1.73   BUN/Creatinine Ratio 14 9 - 20   Sodium 141 134 - 144 mmol/L   Potassium 4.3 3.5 - 5.2 mmol/L   Chloride 99 96 - 106 mmol/L   CO2 25 18 - 29 mmol/L   Calcium 10.0 8.7 - 10.2 mg/dL   Total Protein 7.2 6.0 - 8.5 g/dL   Albumin 4.8 3.5 - 5.5 g/dL   Globulin, Total 2.4 1.5 - 4.5 g/dL   Albumin/Globulin Ratio 2.0 1.2 - 2.2   Bilirubin Total 0.4 0.0 - 1.2 mg/dL   Alkaline Phosphatase 46 39 - 117 IU/L   AST  29 0 - 40 IU/L   ALT 29 0 - 44 IU/L  POCT urinalysis dipstick  Result Value Ref Range   Color, UA dark yellow    Clarity, UA clear    Glucose, UA neg    Bilirubin, UA neg    Ketones, UA neg    Spec Grav, UA 1.025    Blood, UA neg    pH, UA 5.0    Protein, UA neg    Urobilinogen, UA 0.2    Nitrite, UA neg    Leukocytes, UA Negative Negative  IFOBT POC (occult bld, rslt in office)  Result Value Ref Range   IFOBT Negative    Urinalysis Unremarkable.  See EPIC.   Pertinent Imaging: CLINICAL DATA:  Right-sided kidney stone. No abdominal pain or hematuria. Lithotripsy in April 2017.  EXAM: ABDOMEN - 1 VIEW  COMPARISON:  KUB of Apr 25, 2016  FINDINGS: Kidney stones demonstrated previously projecting over the lower pole of the left kidney are not clearly evident today. The stool and gas volume within bowel is increased today. No definite right-sided kidney stones are observed. No ureteral or bladder stones are demonstrated. There are numerous pelvic phleboliths. There degenerative changes of the lower lumbar spine.  IMPRESSION: No definite urinary tract stones are observed today.   Electronically Signed   By: Therron  [place] M.D.   On: 02/19/2017 09:02   Assessment & Plan:    1. History of left renal stones  - KUB did not demonstrate any stones  - RTC in one year for KUB  - Advised to contact our office or seek treatment in the ED if becomes febrile or pain/  vomiting are difficult control in order to arrange for emergent/urgent intervention  2. History of  Hematuria  - UA today was negative  - RTC in one year for UA  - report any gross hematuria  3. BPH with LUTS  - IPSS score is 1/0  - Continue conservative management, avoiding bladder irritants and timed voiding's  - RTC in 12 months for IPSS, PSA and exam   4. Erectile dysfunction  - SHIM score is 20  - Continue Cialis  - RTC in 12 months for repeat SHIM score and exam    Return in about 1 year (around 02/26/2018) for KUB, UA, PSA, SHIM, IPSS and exam.  These notes generated with voice recognition software. I apologize for typographical errors.  Michiel Cowboy, PA-C  [facility] [address] [place], [place] [address] [phone]

## 2017-02-26 ENCOUNTER — Encounter: Payer: Self-pay | Admitting: Urology

## 2017-02-26 ENCOUNTER — Ambulatory Visit (INDEPENDENT_AMBULATORY_CARE_PROVIDER_SITE_OTHER): Payer: BLUE CROSS/BLUE SHIELD | Admitting: Urology

## 2017-02-26 VITALS — BP 124/75 | HR 77 | Ht 68.0 in | Wt 204.2 lb

## 2017-02-26 DIAGNOSIS — Z87448 Personal history of other diseases of urinary system: Secondary | ICD-10-CM | POA: Diagnosis not present

## 2017-02-26 DIAGNOSIS — N138 Other obstructive and reflux uropathy: Secondary | ICD-10-CM

## 2017-02-26 DIAGNOSIS — Z87442 Personal history of urinary calculi: Secondary | ICD-10-CM

## 2017-02-26 DIAGNOSIS — N529 Male erectile dysfunction, unspecified: Secondary | ICD-10-CM | POA: Diagnosis not present

## 2017-02-26 DIAGNOSIS — N401 Enlarged prostate with lower urinary tract symptoms: Secondary | ICD-10-CM | POA: Diagnosis not present

## 2017-02-26 LAB — URINALYSIS, COMPLETE
BILIRUBIN UA: NEGATIVE
LEUKOCYTES UA: NEGATIVE
Nitrite, UA: NEGATIVE
PH UA: 5.5 (ref 5.0–7.5)
RBC, UA: NEGATIVE
Urobilinogen, Ur: 0.2 mg/dL (ref 0.2–1.0)

## 2017-02-26 LAB — MICROSCOPIC EXAMINATION
Bacteria, UA: NONE SEEN
Epithelial Cells (non renal): NONE SEEN /hpf (ref 0–10)
WBC, UA: NONE SEEN /hpf (ref 0–?)

## 2017-04-02 ENCOUNTER — Ambulatory Visit (INDEPENDENT_AMBULATORY_CARE_PROVIDER_SITE_OTHER): Payer: BLUE CROSS/BLUE SHIELD | Admitting: Family Medicine

## 2017-04-02 ENCOUNTER — Ambulatory Visit
Admission: RE | Admit: 2017-04-02 | Discharge: 2017-04-02 | Disposition: A | Discharge status: Home / Self Care | Payer: BLUE CROSS/BLUE SHIELD | Source: Ambulatory Visit | Attending: Family Medicine | Admitting: Family Medicine

## 2017-04-02 VITALS — BP 134/76 | HR 68 | Temp 98.1°F | Resp 14 | Wt 204.0 lb

## 2017-04-02 DIAGNOSIS — M25661 Stiffness of right knee, not elsewhere classified: Secondary | ICD-10-CM | POA: Diagnosis not present

## 2017-04-02 DIAGNOSIS — M7121 Synovial cyst of popliteal space [Baker], right knee: Secondary | ICD-10-CM | POA: Diagnosis not present

## 2017-04-02 DIAGNOSIS — M7989 Other specified soft tissue disorders: Secondary | ICD-10-CM | POA: Insufficient documentation

## 2017-04-02 DIAGNOSIS — R600 Localized edema: Secondary | ICD-10-CM | POA: Diagnosis not present

## 2017-04-02 MED ORDER — NAPROXEN 500 MG PO TABS: 500.0000 mg | ORAL_TABLET | Freq: Two times a day (BID) | ORAL | 1 refills | Status: DC

## 2017-04-02 NOTE — Progress Notes (Signed)
Roger Chang.  MRN: [id number] DOB: May 25, 1961  Subjective:  HPI  Patient is here to discuss right leg swelling. Patient noticed this on 03/29/17 after showering and trying to dry up that his right calf was swollen and is now swollen down to his right ankle. The day before he went to golf but did not have any pain or injury at that time. He has some tightness behind right knee. No pain, no redness present, no warmth to the touch. No shortness of breath, no dizziness and no chest pain.  Patient Active Problem List   Diagnosis Date Noted  . Apnea, sleep 10/14/2016  . Snoring 06/10/2016  . Kidney stones 05/01/2016  . Microscopic hematuria 05/01/2016  . Ureteral stone 05/01/2016  . Failure of erection 06/21/2015  . Brash 06/21/2015  . Essential (primary) hypertension 06/21/2015  . HLD (hyperlipidemia) 06/21/2015  . Hypertrophy of salivary gland 06/21/2015  . Restless leg 06/21/2015  . Avitaminosis D 06/21/2015  . Essential hypertension 05/28/2009  . DYSPNEA 05/28/2009    Past Medical History:  Diagnosis Date  . Dyspnea   . Hypertension    Unspecified  . Microscopic hematuria 03/2016    Social History   Social History  . Marital status: Married    Spouse name: N/A  . Number of children: N/A  . Years of education: N/A   Occupational History  . Full time    Social History Main Topics  . Smoking status: Never Smoker  . Smokeless tobacco: Never Used  . Alcohol use Yes     Comment: occasionally  . Drug use: No  . Sexual activity: Not on file   Other Topics Concern  . Not on file   Social History Narrative   Pt gets regular exercise.    Outpatient Encounter Prescriptions as of 04/02/2017  Medication Sig Note  . aspirin 81 MG tablet Take by mouth. 06/21/2015: Received from: [facility]  . cholecalciferol (VITAMIN D) 1000 UNITS tablet Take by mouth. 06/21/2015: Received from: [facility]  . diltiazem (CARDIZEM CD) 180 MG 24 hr  capsule Take 1 capsule (180 mg total) by mouth daily.   Marland Kitchen labetalol (NORMODYNE) 200 MG tablet Take 1 tablet (200 mg total) by mouth daily.   . meloxicam (MOBIC) 15 MG tablet Reported on 04/25/2016 02/21/2016: Received from: External Pharmacy  . pravastatin (PRAVACHOL) 40 MG tablet Take 1 tablet (40 mg total) by mouth daily.   . ranitidine (ZANTAC) 150 MG capsule Take 1 capsule (150 mg total) by mouth 2 (two) times daily.   Marland Kitchen rOPINIRole (REQUIP) 2 MG tablet TAKE 1/2-1 TABLETS BY MOUTH AT BEDTIME.   . tadalafil (CIALIS) 20 MG tablet 1 tablet every other day   . FLUVIRIN 0.5 ML SUSY ADM 0.5ML IM UTD   . zolpidem (AMBIEN) 10 MG tablet Take 1 tablet (10 mg total) by mouth at bedtime as needed for sleep.    No facility-administered encounter medications on file as of 04/02/2017.     Allergies  Allergen Reactions  . Lisinopril Swelling    Swelling of the lips and dry mouth  . Amlodipine Swelling    Swelling in extremeties more than mild, pt was not comfortable with that  . Hydrochlorothiazide Swelling    Swelling of the foot, possibly contributed to developing gout, felt dehydrated, B/P not controlled  . Losartan Other (See Comments)    Weird dreams, ED, swelling    Review of Systems  Constitutional: Negative.   Respiratory: Negative.  Cardiovascular: Positive for leg swelling. Negative for chest pain and palpitations.  Musculoskeletal: Negative.   Neurological: Negative.     Objective:  BP 134/76   Pulse 68   Temp 98.1 F (36.7 C)   Resp 14   Wt 204 lb (92.5 kg)   BMI 31.02 kg/m   Physical Exam  Constitutional: He is well-developed, well-nourished, and in no distress.  HENT:  Head: Normocephalic and atraumatic.  Eyes: Conjunctivae are normal. Pupils are equal, round, and reactive to light.  Neck: Normal range of motion. Neck supple.  Cardiovascular: Normal rate, regular rhythm, normal heart sounds and intact distal pulses.   No murmur heard. Pulmonary/Chest: Effort normal and  breath sounds normal. No respiratory distress. He has no wheezes.  Musculoskeletal:  Small baker cyst behind the right knee Measurement today: Left calf 15 inches. Right calf 16 inches.   Assessment and Plan :  1. Calf swelling Will check for DVT, Do not think that this is the issue but will check since right calf is 1 inch larger then left calf. Follow pending results. I think this is from bakers cyst found on the exam today, treat with Naproxen. - US Venous Img Lower Unilateral Right; Future Doubt cellulitis. 2. Joint stiffness of knee, right - US Venous Img Lower Unilateral Right; Future May need ortho referral. HPI, Exam and A&P transcribed by Samara Deist, RMA under direction and in the presence of Julieanne Manson, MD. I have done the exam and reviewed the chart and it is accurate to the best of my knowledge. Dentist has been used and  any errors in dictation or transcription are unintentional. Julieanne Manson M.D. [facility]

## 2017-04-09 ENCOUNTER — Ambulatory Visit: Payer: Self-pay | Admitting: Family Medicine

## 2017-06-15 ENCOUNTER — Telehealth: Payer: Self-pay

## 2017-06-15 MED ORDER — ESZOPICLONE 2 MG PO TABS: 2.0000 mg | ORAL_TABLET | Freq: Every evening | ORAL | 5 refills | Status: DC | PRN

## 2017-06-15 NOTE — Telephone Encounter (Signed)
Ok--2 mg q hs prn

## 2017-06-15 NOTE — Telephone Encounter (Signed)
Patient reports that he was prescribed Ambien to help him sleep, and he does not like the way it makes him feel the next day. He wakes up very confused and groggy. He would like to change it to [place] instead. Please call and let him know if this is possible. Contact number is [phone]. Thanks!

## 2017-06-15 NOTE — Telephone Encounter (Signed)
Please review-aa 

## 2017-06-15 NOTE — Telephone Encounter (Signed)
Pt advised, medication updated in the chart and lunesta called in to [facility] in [place].-aa

## 2017-06-29 ENCOUNTER — Telehealth: Payer: Self-pay | Admitting: Family Medicine

## 2017-06-29 NOTE — Telephone Encounter (Signed)
Pt is using the CPAP but is experiencing very chronic dry mouth.  He has used OTC mouth washes, ect but nothing is helping.  Please advise  His call back is [phone]  Thanks teri

## 2017-06-29 NOTE — Telephone Encounter (Signed)
Please advise. Thanks.  

## 2017-07-08 NOTE — Telephone Encounter (Signed)
Make sure CPAP has humidification. Can also try using OTC Biotene

## 2017-07-08 NOTE — Telephone Encounter (Signed)
Patient has called back regarding his CPAP causing dry mouth.    Please advise [phone]  Thanks,  Barth Kirksteri

## 2017-07-08 NOTE — Telephone Encounter (Signed)
Please advise, since Dr. Reece AgarG is out of the office.

## 2017-07-09 NOTE — Telephone Encounter (Signed)
LMOVM for pt to return call 

## 2017-07-09 NOTE — Telephone Encounter (Signed)
Pt is returning call/MW °

## 2017-07-10 NOTE — Telephone Encounter (Signed)
LMTCB

## 2017-07-10 NOTE — Telephone Encounter (Signed)
Pt advised.

## 2017-08-25 ENCOUNTER — Ambulatory Visit (INDEPENDENT_AMBULATORY_CARE_PROVIDER_SITE_OTHER): Payer: BLUE CROSS/BLUE SHIELD | Admitting: Family Medicine

## 2017-08-25 VITALS — BP 120/80 | HR 60 | Temp 97.8°F | Resp 16 | Ht 68.0 in | Wt 206.0 lb

## 2017-08-25 DIAGNOSIS — G2581 Restless legs syndrome: Secondary | ICD-10-CM | POA: Diagnosis not present

## 2017-08-25 DIAGNOSIS — R739 Hyperglycemia, unspecified: Secondary | ICD-10-CM | POA: Diagnosis not present

## 2017-08-25 DIAGNOSIS — Z23 Encounter for immunization: Secondary | ICD-10-CM | POA: Diagnosis not present

## 2017-08-25 DIAGNOSIS — R682 Dry mouth, unspecified: Secondary | ICD-10-CM

## 2017-08-25 LAB — POCT GLYCOSYLATED HEMOGLOBIN (HGB A1C): HEMOGLOBIN A1C: 5.9

## 2017-08-25 NOTE — Patient Instructions (Addendum)
Dry mouth:  Decrease dose of Zantac to once daily for 2 weeks and see if this helps.   If dry mouth improves and acid reflux does not return, discontinue Zantac totally.  If the acid reflux comes back at any time go back on the Zantac.

## 2017-08-25 NOTE — Progress Notes (Signed)
Roger Chang W Kizer Jr.  MRN: [id number] DOB: 01/19/1961  Subjective:  HPI   The patient is a 56 year old male who presents today for follow up of elevated glucose.  He was last seen on 04/02/17.  At that time his A1C was 5.8.  The patient states he was not really aware of his diagnosis of hyperglycemia.  We discussed what his A1C level was last visit which was 5.8.  Patient was instructed on the importance of diet and exercise in maintaining his level and keeping his weight and BMI down.    Patient is also needs BMI education today.   Body mass index is 31.32 kg/m.  Since the last visit the patient had been instructed to discontinue his Ropinirole due to dry mouth.  He states that this was some better but not totally resolved.   Patient Active Problem List   Diagnosis Date Noted  . Apnea, sleep 10/14/2016  . Snoring 06/10/2016  . Kidney stones 05/01/2016  . Microscopic hematuria 05/01/2016  . Ureteral stone 05/01/2016  . Failure of erection 06/21/2015  . Brash 06/21/2015  . Essential (primary) hypertension 06/21/2015  . HLD (hyperlipidemia) 06/21/2015  . Hypertrophy of salivary gland 06/21/2015  . Restless leg 06/21/2015  . Avitaminosis D 06/21/2015  . Essential hypertension 05/28/2009  . DYSPNEA 05/28/2009    Past Medical History:  Diagnosis Date  . Dyspnea   . Hypertension    Unspecified  . Microscopic hematuria 03/2016    Social History   Social History  . Marital status: Married    Spouse name: N/A  . Number of children: N/A  . Years of education: N/A   Occupational History  . Full time    Social History Main Topics  . Smoking status: Never Smoker  . Smokeless tobacco: Never Used  . Alcohol use Yes     Comment: occasionally  . Drug use: No  . Sexual activity: Not on file   Other Topics Concern  . Not on file   Social History Narrative   Pt gets regular exercise.    Outpatient Encounter Prescriptions as of 08/25/2017  Medication Sig Note  . aspirin 81  MG tablet Take by mouth. 06/21/2015: [facility]  . cholecalciferol (VITAMIN D) 1000 UNITS tablet Take by mouth. 06/21/2015: Re[facility] from: [facility]  . diltiazem (CARDIZEM CD) 180 MG 24 hr capsule Take 1 capsule (180 mg total) by mouth daily.   . eszopiclone (LUNESTA) 2 MG TABS tablet Take 1 tablet (2 mg total) by mouth at bedtime as needed for sleep. Take immediately before bedtime   . labetalol (NORMODYNE) 200 MG tablet Take 1 tablet (200 mg total) by mouth daily.   . naproxen (NAPROSYN) 500 MG tablet Take 1 tablet (500 mg total) by mouth 2 (two) times daily with a meal.   . pravastatin (PRAVACHOL) 40 MG tablet Take 1 tablet (40 mg total) by mouth daily.   . ranitidine (ZANTAC) 150 MG capsule Take 1 capsule (150 mg total) by mouth 2 (two) times daily.   . tadalafil (CIALIS) 20 MG tablet 1 tablet every other day   . rOPINIRole (REQUIP) 2 MG tablet TAKE 1/2-1 TABLETS BY MOUTH AT BEDTIME. (Patient not taking: Reported on 08/25/2017)   . [DISCONTINUED] FLUVIRIN 0.5 ML SUSY ADM 0.5ML IM UTD    No facility-administered encounter medications on file as of 08/25/2017.     Allergies  Allergen Reactions  . Lisinopril Swelling    Swelling of the  lips and dry mouth  . Amlodipine Swelling    Swelling in extremeties more than mild, pt was not comfortable with that  . Hydrochlorothiazide Swelling    Swelling of the foot, possibly contributed to developing gout, felt dehydrated, B/P not controlled  . Ambien [Zolpidem]     Very groggy and confused the morning after  . Losartan Other (See Comments)    Weird dreams, ED, swelling    Review of Systems  Constitutional: Negative for fever and malaise/fatigue.  Respiratory: Negative for cough, shortness of breath and wheezing.   Cardiovascular: Negative for chest pain, palpitations and orthopnea.  Genitourinary: Negative for frequency.  Neurological: Negative for dizziness, weakness and headaches.    Endo/Heme/Allergies: Negative for polydipsia.    Objective:  BP 120/80 (BP Location: Right Arm, Patient Position: Sitting, Cuff Size: Normal)   Pulse 60   Temp 97.8 F (36.6 C) (Oral)   Resp 16   Wt 206 lb (93.4 kg)   BMI 31.32 kg/m   Physical Exam  Constitutional: He is well-developed, well-nourished, and in no distress.  HENT:  Head: Normocephalic and atraumatic.  Eyes: Pupils are equal, round, and reactive to light. Conjunctivae are normal.  Neck: Normal range of motion.  Cardiovascular: Normal rate, regular rhythm and normal heart sounds.   Pulmonary/Chest: Effort normal and breath sounds normal.  Skin: Skin is warm and dry.  Psychiatric: Mood, memory, affect and judgment normal.    Assessment and Plan :   1. Hyperglycemia  - POCT glycosylated hemoglobin (Hb A1C) --5.9 today. 2. Need for influenza vaccination  - Flu Vaccine QUAD 6+ mos PF IM (Fluarix Quad PF)  3. Restless leg Will have the patient stay off of the Ropinirole due to side effect of dry mouth.  4. Dry mouth Somewhat improved off of Ropinirole.  Zantac may be contributing.  Will decrease dose and see if this helps.  If he does not have increased symptoms of GERD off of the Zantac he can stop it totally.  If GERD increases he is to go back on the regular dose of the Zantac.  HPI, Exam and A&P Transcribed under the direction and in the presence of Julieanne Manson, Montez Hageman., MD. Electronically Signed: Janey Greaser, RMA I have done the exam and reviewed the chart and it is accurate to the best of my knowledge. Dentist has been used and  any errors in dictation or transcription are unintentional. Julieanne Manson M.D. [facility]

## 2017-09-14 DIAGNOSIS — L821 Other seborrheic keratosis: Secondary | ICD-10-CM | POA: Diagnosis not present

## 2017-09-14 DIAGNOSIS — L814 Other melanin hyperpigmentation: Secondary | ICD-10-CM | POA: Diagnosis not present

## 2017-09-14 DIAGNOSIS — D229 Melanocytic nevi, unspecified: Secondary | ICD-10-CM | POA: Diagnosis not present

## 2017-09-14 DIAGNOSIS — D2261 Melanocytic nevi of right upper limb, including shoulder: Secondary | ICD-10-CM | POA: Diagnosis not present

## 2017-09-14 DIAGNOSIS — D485 Neoplasm of uncertain behavior of skin: Secondary | ICD-10-CM | POA: Diagnosis not present

## 2017-09-14 DIAGNOSIS — L918 Other hypertrophic disorders of the skin: Secondary | ICD-10-CM | POA: Diagnosis not present

## 2017-09-15 DIAGNOSIS — G4733 Obstructive sleep apnea (adult) (pediatric): Secondary | ICD-10-CM | POA: Diagnosis not present

## 2017-09-19 IMAGING — US US RENAL
1 series · 14 of 25 positions shown · non-contrast
Comparison: 03/06/2015

CLINICAL DATA: Followup kidney stones

EXAM:
RENAL / URINARY TRACT ULTRASOUND COMPLETE

[Series 1: us renal · 0.23mm/px · 14 of 41 slices shown]
[im 1/41]
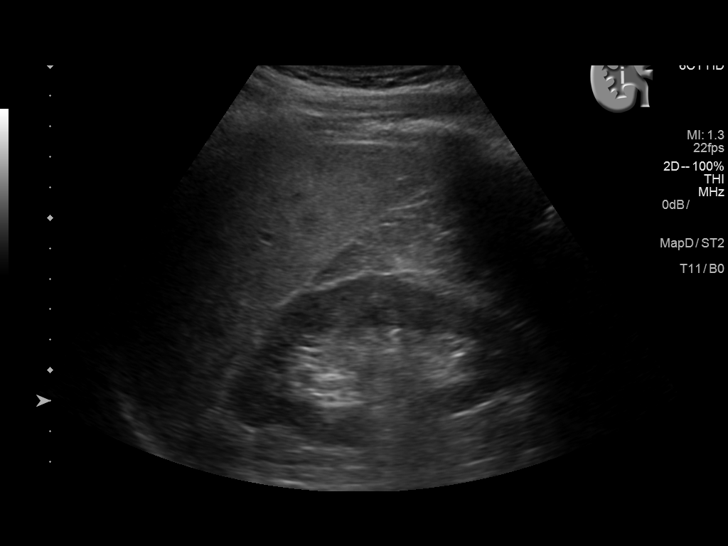
[im 4/41]
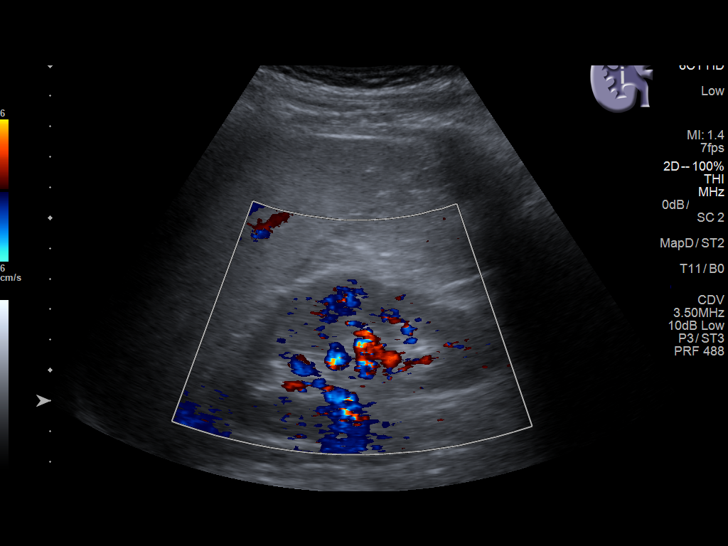
[im 7/41]
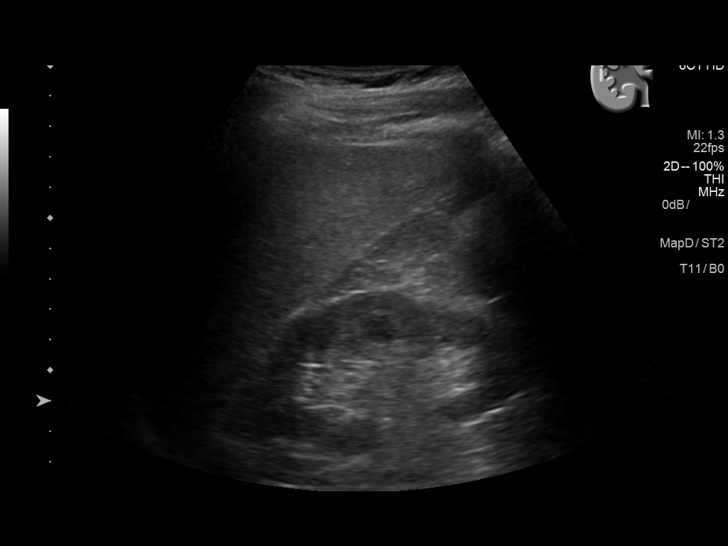
[im 11/41]
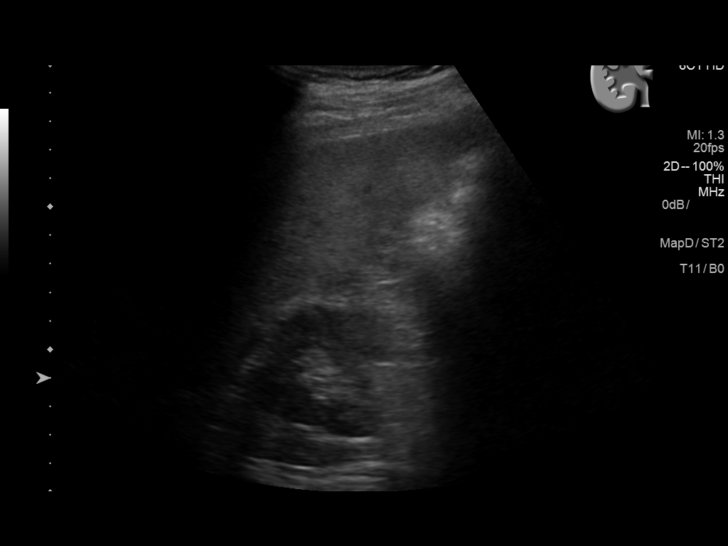
[im 14/41]
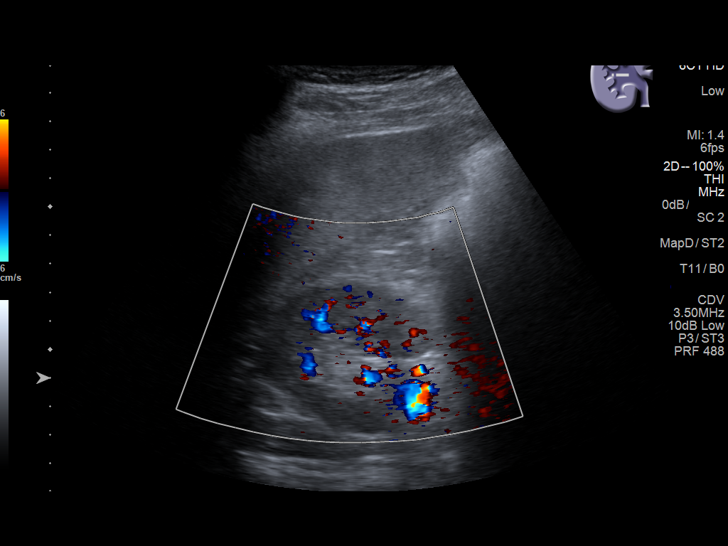
[im 16/41]
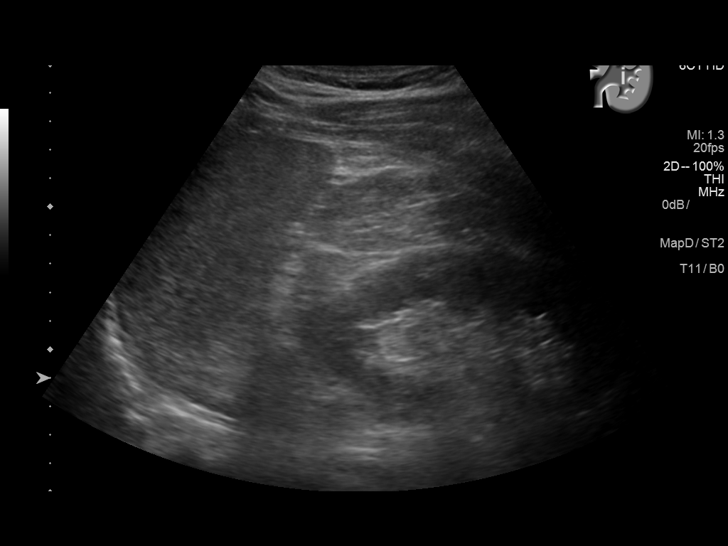
[im 19/41]
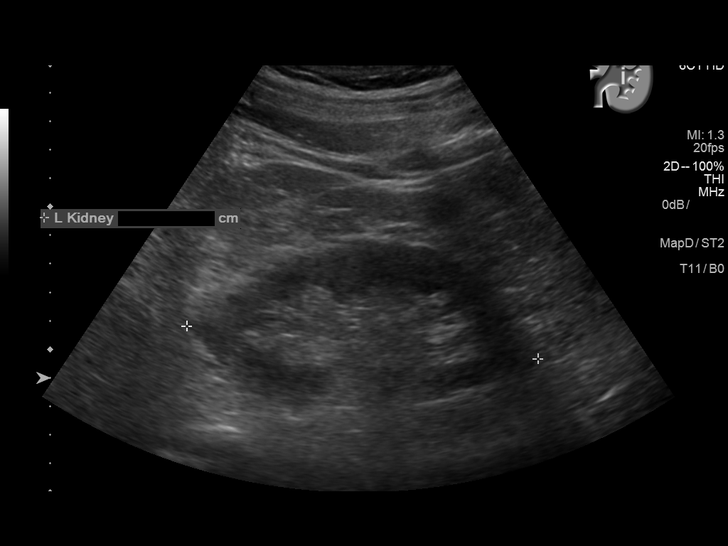
[im 22/41]
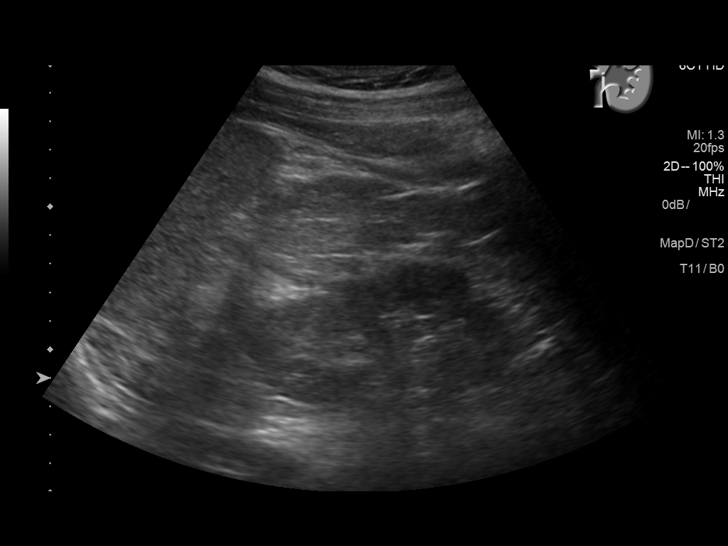
[im 26/41]
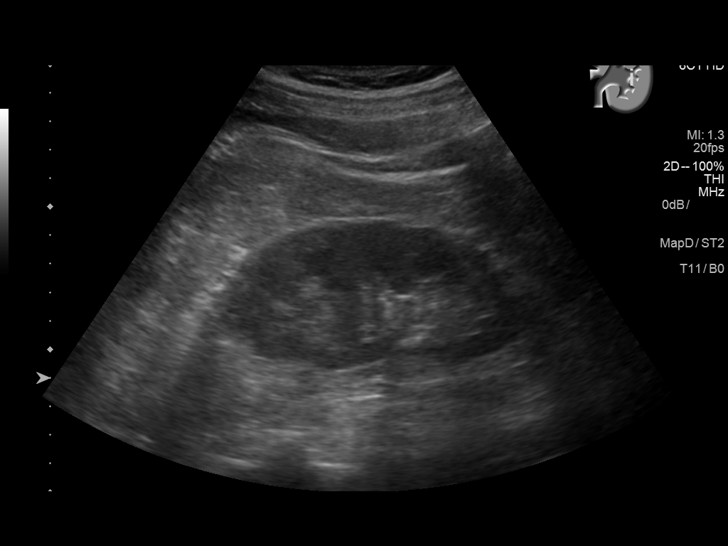
[im 27/41]
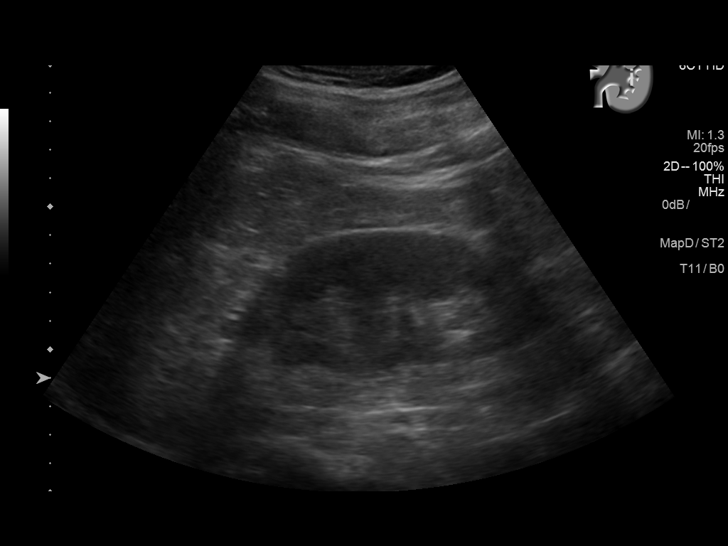
[im 31/41]
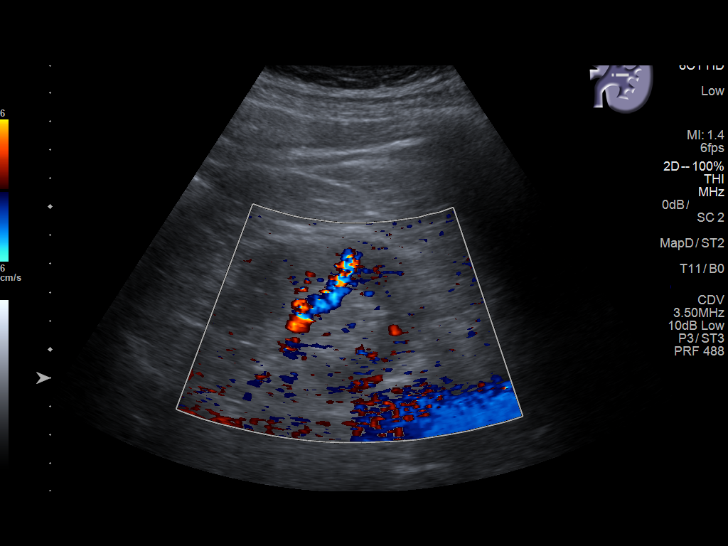
[im 34/41]
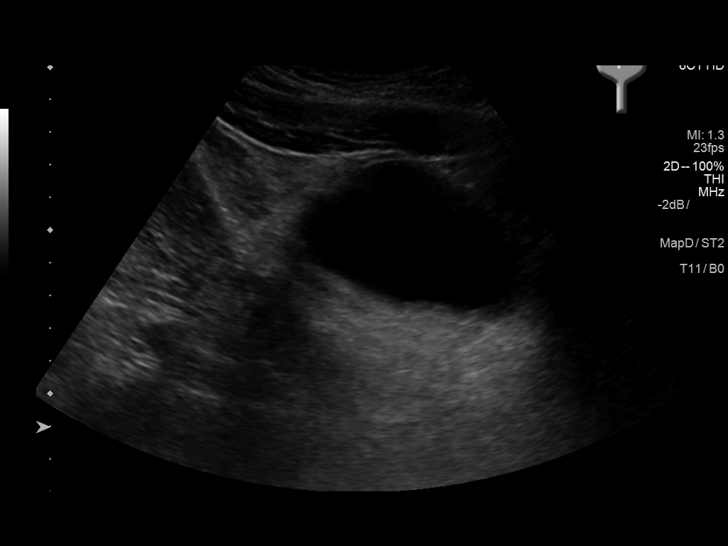
[im 37/41]
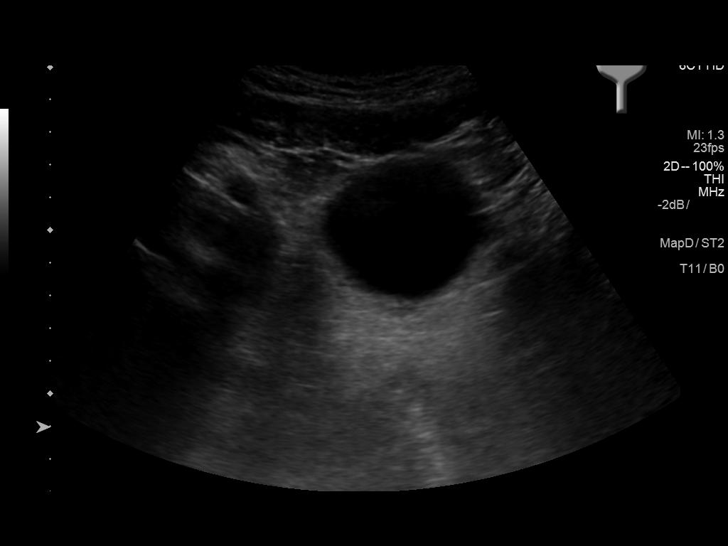
[im 41/41]
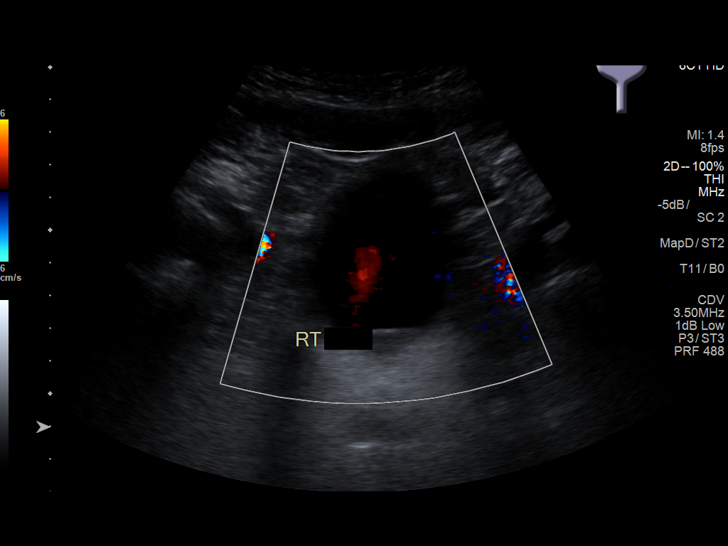

[14 of 25 positions shown; findings below may reference images not displayed]

FINDINGS: Right Kidney:

Length: 10.6 cm. Echogenicity within normal limits. No mass or
hydronephrosis visualized.

Left Kidney:

Length: 12.4 cm. Echogenicity within normal limits. No mass or
hydronephrosis visualized.

Bladder:

Appears normal for degree of bladder distention.
IMPRESSION: 1. Normal renal sonogram.

## 2017-10-10 ENCOUNTER — Other Ambulatory Visit: Payer: Self-pay | Admitting: Family Medicine

## 2017-11-25 DIAGNOSIS — L578 Other skin changes due to chronic exposure to nonionizing radiation: Secondary | ICD-10-CM | POA: Diagnosis not present

## 2017-11-25 DIAGNOSIS — L820 Inflamed seborrheic keratosis: Secondary | ICD-10-CM | POA: Diagnosis not present

## 2017-11-25 DIAGNOSIS — D485 Neoplasm of uncertain behavior of skin: Secondary | ICD-10-CM | POA: Diagnosis not present

## 2017-11-25 DIAGNOSIS — L821 Other seborrheic keratosis: Secondary | ICD-10-CM | POA: Diagnosis not present

## 2017-11-25 DIAGNOSIS — H2513 Age-related nuclear cataract, bilateral: Secondary | ICD-10-CM | POA: Diagnosis not present

## 2017-11-28 IMAGING — CR DG ABDOMEN 1V
1 series · 1 of 1 positions shown · non-contrast
Comparison: CT urogram dated March 05, 2016

CLINICAL DATA: Status post lithotripsy on April 10, 2016 for
left-sided kidney stones, follow-up study, patient reports left
flank pain.

EXAM:
ABDOMEN - 1 VIEW

[dg abd 1 view]
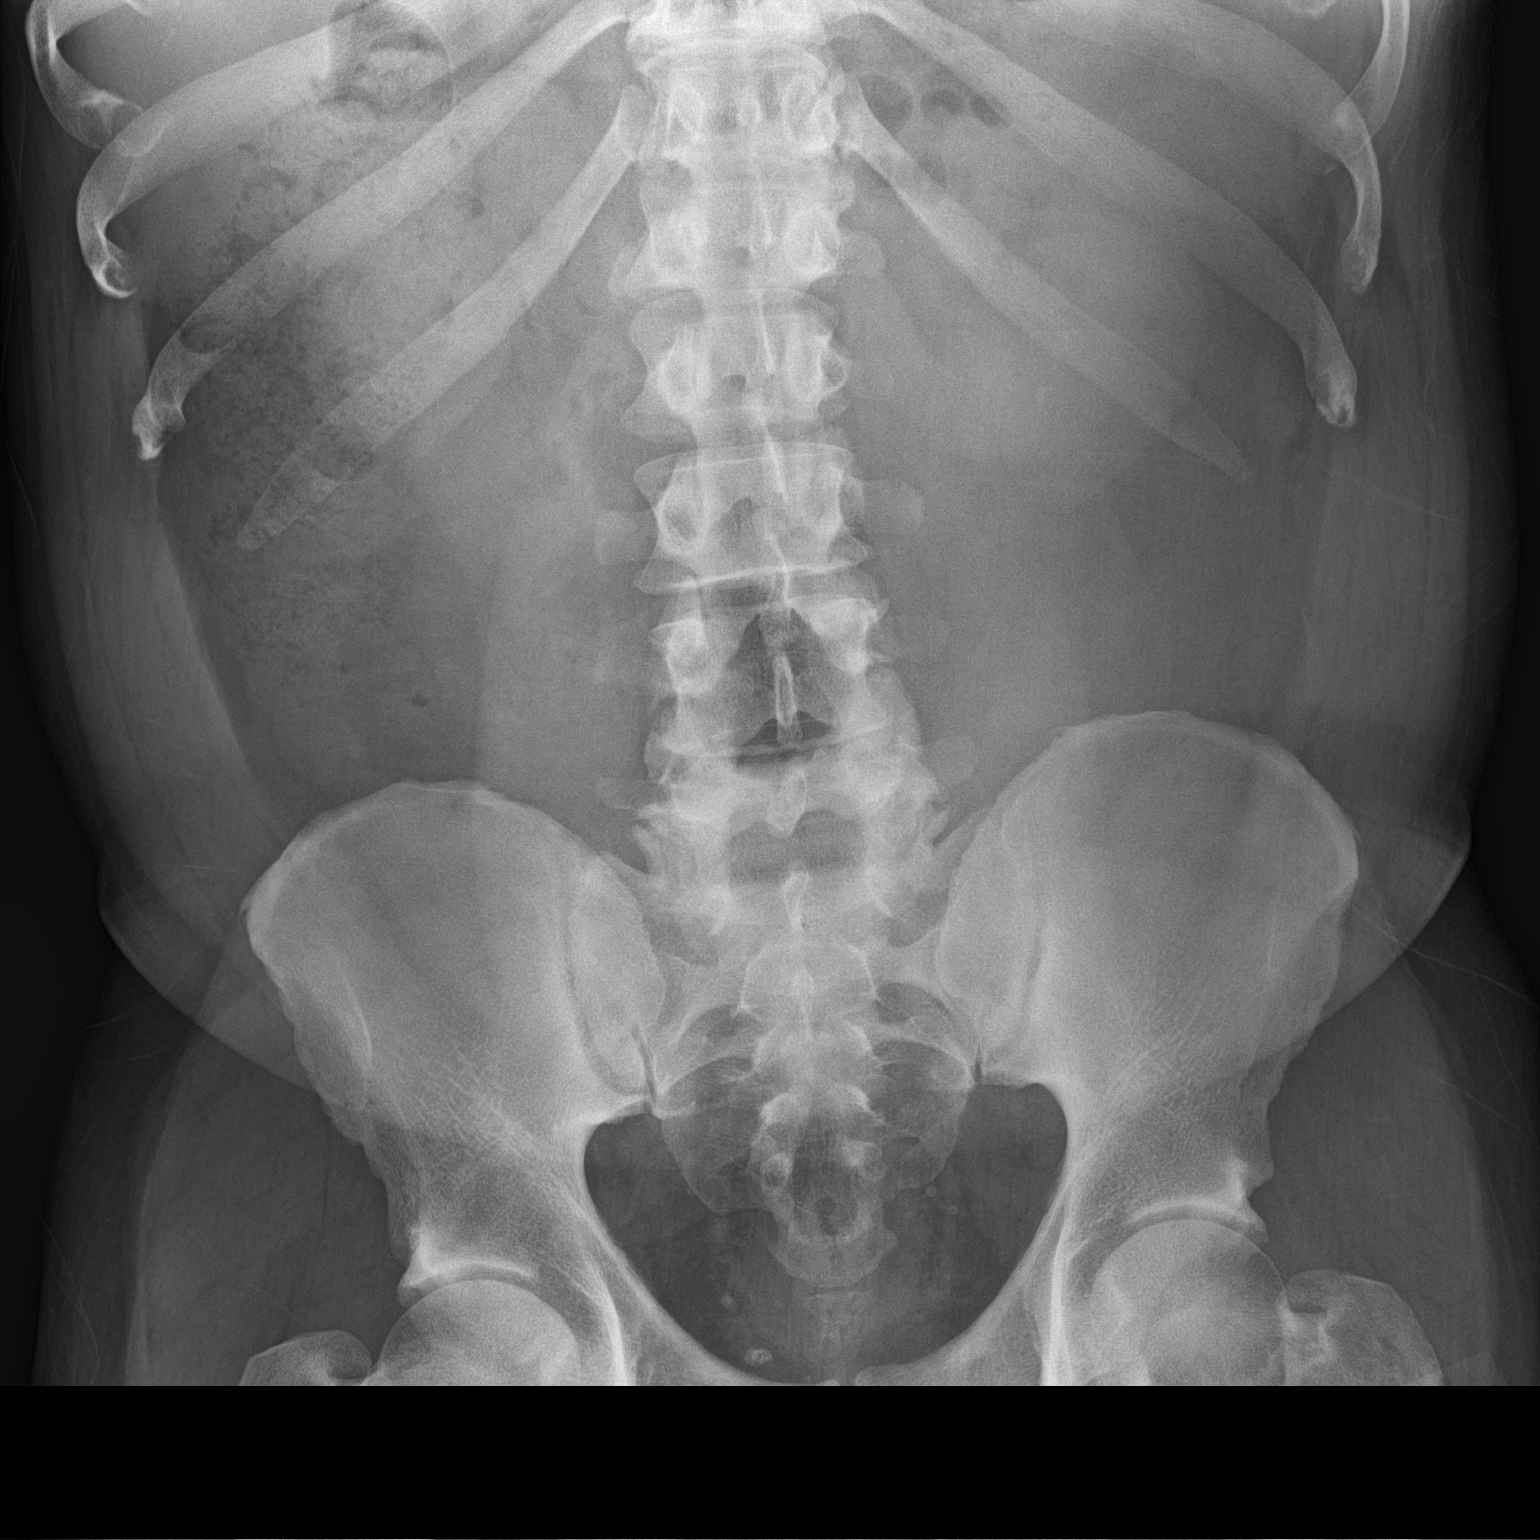

[1 of 1 positions shown; findings below may reference images not displayed]

FINDINGS: There is an approximately 2 x 6 mm stone projecting over the lower
pole of the left kidney. No definite stones are observed elsewhere
on the left. On the right no stones are evident. There are
phleboliths within the pelvis. The bowel gas pattern is
unremarkable. The bony structures exhibit no acute abnormality.
IMPRESSION: 2 x 6 mm lower pole stone on the left. No definite stones observed
elsewhere.

## 2017-12-11 DIAGNOSIS — H269 Unspecified cataract: Secondary | ICD-10-CM | POA: Diagnosis not present

## 2017-12-11 DIAGNOSIS — H2511 Age-related nuclear cataract, right eye: Secondary | ICD-10-CM | POA: Diagnosis not present

## 2017-12-18 DIAGNOSIS — G4733 Obstructive sleep apnea (adult) (pediatric): Secondary | ICD-10-CM | POA: Diagnosis not present

## 2018-01-06 ENCOUNTER — Other Ambulatory Visit: Payer: Self-pay | Admitting: Family Medicine

## 2018-02-22 ENCOUNTER — Other Ambulatory Visit: Payer: BLUE CROSS/BLUE SHIELD

## 2018-02-22 ENCOUNTER — Ambulatory Visit: Payer: Self-pay | Admitting: Family Medicine

## 2018-03-01 ENCOUNTER — Ambulatory Visit: Payer: BLUE CROSS/BLUE SHIELD | Admitting: Urology

## 2018-03-02 ENCOUNTER — Other Ambulatory Visit: Payer: BLUE CROSS/BLUE SHIELD

## 2018-03-02 DIAGNOSIS — N401 Enlarged prostate with lower urinary tract symptoms: Secondary | ICD-10-CM | POA: Diagnosis not present

## 2018-03-03 LAB — PSA: PROSTATE SPECIFIC AG, SERUM: 0.9 ng/mL (ref 0.0–4.0)

## 2018-03-04 ENCOUNTER — Ambulatory Visit
Admission: RE | Admit: 2018-03-04 | Discharge: 2018-03-04 | Disposition: A | Discharge status: Home / Self Care | Payer: BLUE CROSS/BLUE SHIELD | Source: Ambulatory Visit | Attending: Urology | Admitting: Urology

## 2018-03-04 ENCOUNTER — Ambulatory Visit: Payer: BLUE CROSS/BLUE SHIELD | Admitting: Family Medicine

## 2018-03-04 ENCOUNTER — Other Ambulatory Visit: Payer: Self-pay

## 2018-03-04 ENCOUNTER — Other Ambulatory Visit: Payer: Self-pay | Admitting: Urology

## 2018-03-04 ENCOUNTER — Other Ambulatory Visit: Payer: Self-pay | Admitting: Family Medicine

## 2018-03-04 VITALS — BP 140/80 | HR 72 | Temp 98.2°F | Resp 16

## 2018-03-04 DIAGNOSIS — Z1211 Encounter for screening for malignant neoplasm of colon: Secondary | ICD-10-CM

## 2018-03-04 DIAGNOSIS — N200 Calculus of kidney: Secondary | ICD-10-CM | POA: Diagnosis not present

## 2018-03-04 DIAGNOSIS — R7309 Other abnormal glucose: Secondary | ICD-10-CM

## 2018-03-04 DIAGNOSIS — Z0000 Encounter for general adult medical examination without abnormal findings: Secondary | ICD-10-CM

## 2018-03-04 DIAGNOSIS — E7800 Pure hypercholesterolemia, unspecified: Secondary | ICD-10-CM

## 2018-03-04 LAB — POCT URINALYSIS DIPSTICK
BILIRUBIN UA: NEGATIVE
Blood, UA: NEGATIVE
Glucose, UA: NEGATIVE
KETONES UA: NEGATIVE
Leukocytes, UA: NEGATIVE
Nitrite, UA: NEGATIVE
Protein, UA: NEGATIVE
Spec Grav, UA: 1.01 (ref 1.010–1.025)
Urobilinogen, UA: 0.2 E.U./dL
pH, UA: 5 (ref 5.0–8.0)

## 2018-03-04 LAB — IFOBT (OCCULT BLOOD): IFOBT: NEGATIVE

## 2018-03-04 NOTE — Progress Notes (Signed)
KUB order in. 

## 2018-03-04 NOTE — Progress Notes (Signed)
Patient: Roger Gulley., Male    DOB: 11/25/1961, 57 y.o.   MRN: [id number] Visit Date: 03/04/2018  Today's Provider: Megan Mans, Roger Chang   Chief Complaint  Patient presents with  . Annual Exam   Subjective:  Roger Mceachron. is a 57 y.o. male who presents today for health maintenance and complete physical. He feels well. He reports exercising daily. He reports he is sleeping well.  Immunization History  Administered Date(s) Administered  . Influenza,inj,Quad PF,6+ Mos 08/25/2017  . Influenza-Unspecified 01/29/2017  . Tdap 08/13/2007   09/26/09 Colonoscopy-diverticulosis, repeat 10 years.  Review of Systems  Constitutional: Negative.   HENT: Negative.   Eyes: Negative.   Respiratory: Positive for apnea.   Cardiovascular: Negative.   Gastrointestinal: Negative.   Endocrine: Negative.   Genitourinary: Negative.   Musculoskeletal: Negative.   Skin: Negative.   Allergic/Immunologic: Negative.   Neurological: Negative.   Hematological: Negative.   Psychiatric/Behavioral: Negative.     Social History   Socioeconomic History  . Marital status: Married    Spouse name: Not on file  . Number of children: Not on file  . Years of education: Not on file  . Highest education level: Not on file  Social Needs  . Financial resource strain: Not on file  . Food insecurity - worry: Not on file  . Food insecurity - inability: Not on file  . Transportation needs - medical: Not on file  . Transportation needs - non-medical: Not on file  Occupational History  . Occupation: Full time  Tobacco Use  . Smoking status: Never Smoker  . Smokeless tobacco: Never Used  Substance and Sexual Activity  . Alcohol use: Yes    Comment: occasionally  . Drug use: No  . Sexual activity: Not on file  Other Topics Concern  . Not on file  Social History Narrative   Pt gets regular exercise.    Patient Active Problem List   Diagnosis Date Noted  . Apnea, sleep 10/14/2016  . Snoring 06/10/2016   . Kidney stones 05/01/2016  . Microscopic hematuria 05/01/2016  . Ureteral stone 05/01/2016  . Failure of erection 06/21/2015  . Brash 06/21/2015  . Essential (primary) hypertension 06/21/2015  . HLD (hyperlipidemia) 06/21/2015  . Hypertrophy of salivary gland 06/21/2015  . Restless leg 06/21/2015  . Avitaminosis D 06/21/2015  . Essential hypertension 05/28/2009  . DYSPNEA 05/28/2009    Past Surgical History:  Procedure Laterality Date  . CHOLECYSTECTOMY    . EXTRACORPOREAL SHOCK WAVE LITHOTRIPSY Left 04/10/2016   Procedure: EXTRACORPOREAL SHOCK WAVE LITHOTRIPSY (ESWL);  Surgeon: Hildred Laser, Roger Chang;  Location: [facility] ORS;  Service: Urology;  Laterality: Left;  . HEMORRHOID SURGERY  2000  . LASIK    . VASECTOMY  1999    His family history includes Breast cancer in his mother; Cancer in his other; Heart attack in his father; Lung cancer in his father.     Outpatient Encounter Medications as of 03/04/2018  Medication Sig Note  . aspirin 81 MG tablet Take by mouth. 06/21/2015: Received from: [facility]  . cholecalciferol (VITAMIN D) 1000 UNITS tablet Take by mouth. 06/21/2015: Received from: [facility]  . diltiazem (CARDIZEM CD) 180 MG 24 hr capsule Take 1 capsule (180 mg total) by mouth daily.   . eszopiclone (LUNESTA) 2 MG TABS tablet TAKE ONE TABLET BY MOUTH NIGHTLY AT BEDTIME   . labetalol (NORMODYNE) 200 MG tablet Take 1 tablet (200 mg total) by mouth daily.   Marland Kitchen  pravastatin (PRAVACHOL) 40 MG tablet Take 1 tablet (40 mg total) by mouth daily.   . [DISCONTINUED] naproxen (NAPROSYN) 500 MG tablet Take 1 tablet (500 mg total) by mouth 2 (two) times daily with a meal.   . [DISCONTINUED] ranitidine (ZANTAC) 150 MG capsule Take 1 capsule (150 mg total) by mouth 2 (two) times daily.   . [DISCONTINUED] tadalafil (CIALIS) 20 MG tablet 1 tablet every other day   . [DISCONTINUED] diltiazem (CARDIZEM CD) 180 MG 24 hr capsule TAKE 1 CAPSULE BY MOUTH ONCE  DAILY.    No facility-administered encounter medications on file as of 03/04/2018.     Patient Care Team: Maple HudsonGilbert, Roger Mccandlish L Jr., Roger Chang as PCP - General (Family Medicine)      Objective:   Vitals:  Vitals:   03/04/18 0840  BP: 140/80  Pulse: 72  Resp: 16  Temp: 98.2 F (36.8 C)  TempSrc: Oral    Physical Exam  Constitutional: He is oriented to person, place, and time. He appears well-developed and well-nourished.  HENT:  Head: Normocephalic and atraumatic.  Right Ear: External ear normal.  Left Ear: External ear normal.  Nose: Nose normal.  Mouth/Throat: Oropharynx is clear and moist.  Eyes: Conjunctivae and EOM are normal. Pupils are equal, round, and reactive to light.  Neck: Normal range of motion. Neck supple.  Cardiovascular: Normal rate, regular rhythm, normal heart sounds and intact distal pulses.  Pulmonary/Chest: Effort normal and breath sounds normal.  Abdominal: Soft. Bowel sounds are normal.  Musculoskeletal: Normal range of motion.  Neurological: He is alert and oriented to person, place, and time.  Skin: Skin is warm and dry.  Psychiatric: He has a normal mood and affect. His behavior is normal. Judgment and thought content normal.   Fall Risk  03/04/2018 08/15/2015  Falls in the past year? No No   Current Exercise Habits: Home exercise routine, Frequency (Times/Week): 7   Functional Status Survey: Is the patient deaf or have difficulty hearing?: No Does the patient have difficulty seeing, even when wearing glasses/contacts?: No Does the patient have difficulty concentrating, remembering, or making decisions?: No Does the patient have difficulty walking or climbing stairs?: No Does the patient have difficulty dressing or bathing?: No Does the patient have difficulty doing errands alone such as visiting a doctor's office or shopping?: No  Depression Screen PHQ 2/9 Scores 03/04/2018 02/24/2017 08/15/2015  PHQ - 2 Score 0 0 0  PHQ- 9 Score 0 0 -      Assessment & Plan:     Routine Health Maintenance and Physical Exam  Exercise Activities and Dietary recommendations Goals    None      Immunization History  Administered Date(s) Administered  . Influenza,inj,Quad PF,6+ Mos 08/25/2017  . Influenza-Unspecified 01/29/2017  . Tdap 08/13/2007    Health Maintenance  Topic Date Due  . Hepatitis C Screening  Jul 08, 1961  . HIV Screening  07/26/1974  . TETANUS/TDAP  08/12/2017  . COLONOSCOPY  09/27/2019  . INFLUENZA VACCINE  Completed     Discussed health benefits of physical activity, and encouraged him to engage in regular exercise appropriate for his age and condition.  OSA On CPAP. HTN HLD   I have done the exam and reviewed the chart and it is accurate to the best of my knowledge. DentistDragon  technology has been used and  any errors in dictation or transcription are unintentional. Julieanne Mansonichard Muriel Wilber M.D. [facility]

## 2018-03-05 LAB — COMPREHENSIVE METABOLIC PANEL
ALBUMIN: 4.5 g/dL (ref 3.5–5.5)
ALT: 45 IU/L — AB (ref 0–44)
AST: 39 IU/L (ref 0–40)
Albumin/Globulin Ratio: 1.7 (ref 1.2–2.2)
Alkaline Phosphatase: 37 IU/L — ABNORMAL LOW (ref 39–117)
BUN/Creatinine Ratio: 10 (ref 9–20)
BUN: 11 mg/dL (ref 6–24)
Bilirubin Total: 0.3 mg/dL (ref 0.0–1.2)
CHLORIDE: 100 mmol/L (ref 96–106)
CO2: 27 mmol/L (ref 20–29)
Calcium: 9.8 mg/dL (ref 8.7–10.2)
Creatinine, Ser: 1.06 mg/dL (ref 0.76–1.27)
GFR calc Af Amer: 89 mL/min/{1.73_m2} (ref >59)
GFR calc non Af Amer: 77 mL/min/{1.73_m2} (ref >59)
GLOBULIN, TOTAL: 2.7 g/dL (ref 1.5–4.5)
GLUCOSE: 104 mg/dL — AB (ref 65–99)
POTASSIUM: 4.3 mmol/L (ref 3.5–5.2)
Sodium: 142 mmol/L (ref 134–144)
Total Protein: 7.2 g/dL (ref 6.0–8.5)

## 2018-03-05 LAB — LIPID PANEL WITH LDL/HDL RATIO
CHOLESTEROL TOTAL: 252 mg/dL — AB (ref 100–199)
HDL: 48 mg/dL (ref >39)
LDL CALC: 129 mg/dL — AB (ref 0–99)
LDL/HDL RATIO: 2.7 ratio (ref 0.0–3.6)
Triglycerides: 373 mg/dL — ABNORMAL HIGH (ref 0–149)
VLDL Cholesterol Cal: 75 mg/dL — ABNORMAL HIGH (ref 5–40)

## 2018-03-05 LAB — HEMOGLOBIN A1C
Est. average glucose Bld gHb Est-mCnc: 120 mg/dL
HEMOGLOBIN A1C: 5.8 % — AB (ref 4.8–5.6)

## 2018-03-05 LAB — CBC WITH DIFFERENTIAL/PLATELET
Basophils Absolute: 0 10*3/uL (ref 0.0–0.2)
Basos: 1 %
EOS (ABSOLUTE): 0.1 10*3/uL (ref 0.0–0.4)
EOS: 3 %
HEMATOCRIT: 46.6 % (ref 37.5–51.0)
HEMOGLOBIN: 15.7 g/dL (ref 13.0–17.7)
IMMATURE GRANULOCYTES: 0 %
Immature Grans (Abs): 0 10*3/uL (ref 0.0–0.1)
Lymphocytes Absolute: 2.2 10*3/uL (ref 0.7–3.1)
Lymphs: 50 %
MCH: 31.3 pg (ref 26.6–33.0)
MCHC: 33.7 g/dL (ref 31.5–35.7)
MCV: 93 fL (ref 79–97)
MONOCYTES: 12 %
Monocytes Absolute: 0.5 10*3/uL (ref 0.1–0.9)
NEUTROS PCT: 34 %
Neutrophils Absolute: 1.5 10*3/uL (ref 1.4–7.0)
Platelets: 288 10*3/uL (ref 150–379)
RBC: 5.02 x10E6/uL (ref 4.14–5.80)
RDW: 14.3 % (ref 12.3–15.4)
WBC: 4.4 10*3/uL (ref 3.4–10.8)

## 2018-03-05 LAB — TSH: TSH: 2.4 u[IU]/mL (ref 0.450–4.500)

## 2018-03-08 NOTE — Progress Notes (Signed)
9:28 AM   Roger Chang. 1961/11/04 [id number]  Referring provider: Maple Hudson., MD [address] [place], [place] [address]  No chief complaint on file.   HPI: 57 yo AAM with a history of left renal calculi, AMH, ED and BPH with LU TS who presents today for a yearly follow up.    History of nephrolithiasis Patient is status post ESWL for a 5 mm UPJ stone with Dr. Sherryl Barters on 04/10/2016.  KUB taken on 04/25/2016 noted a 6 mm stone over the lower pole of the left kidney.  RUS completed on 05/20/2016 was normal.  KUB taken on 02/19/2017 did not demonstrate any urinary stones.  Stone composition is unknown.  KUB taken on 03/04/2018 noted no definite urinary calculi.  Patient has not had any passage of stone fragments, gross hematuria or flank pain since his last visit with [place] almost 1 year ago.  History of hematuria CT Urogram completed on 03/05/2016 noted 3 calculi within the left renal collecting system. The largest calculus measuring 5 mm is positioned within the renal pelvis with the patient supine, although drops into the UPJ with the patient prone. This may be intermittently obstructing.  No evidence of renal mass or urothelial lesion. Cystoscopy completed on  03/12/2016 was negative.  ESWL completed.  Stone free at this time.  Does not report any gross hematuria.  His UA today was negative for AMH.    BPH WITH LUTS His IPSS score today is 9 which is moderate lower urinary tract symptomatology.  He is pleased with his quality life due to his urinary symptoms.   His major complaint today is nocturia x 1.  His previous I PSS score was 1/0.  He has had these symptoms for the last few years.  He denies any dysuria, hematuria or suprapubic pain.   He also denies any recent fevers, chills, nausea or vomiting.  He does not have a family history of PCa. IPSS    Row Name 03/09/18 0900         International Prostate Symptom Score   How often have you had the sensation  of not emptying your bladder?  Less than 1 in 5     How often have you had to urinate less than every two hours?  Less than half the time     How often have you found you stopped and started again several times when you urinated?  Less than 1 in 5 times     How often have you found it difficult to postpone urination?  Less than 1 in 5 times     How often have you had a weak urinary stream?  Less than 1 in 5 times     How often have you had to strain to start urination?  Less than 1 in 5 times     How many times did you typically get up at night to urinate?  2 Times     Total IPSS Score  9       Quality of Life due to urinary symptoms   If you were to spend the rest of your life with your urinary condition just the way it is now how would you feel about that?  Pleased        Score:  1-7 Mild 8-19 Moderate 20-35 Severe   Erectile dysfunction His SHIM score is 20, which is mild erectile dysfunction.   His previous SHIM score was 20.  He has been having difficulty with erections off and one for the last two years.   His major complaint is inconsistency with erections.  His libido is preserved.   His risk factors for ED are age, BPH, HTN, HLD, sleep apnea and blood pressure medications.  He denies any painful erections or curvatures with his erections.   He is still having spontaneous erections.  He has tried Cialis in the past., but he has not taken the medication in over one year.  SHIM    Row Name 03/09/18 0908         SHIM: Over the last 6 months:   How do you rate your confidence that you could get and keep an erection?  High     When you had erections with sexual stimulation, how often were your erections hard enough for penetration (entering your partner)?  Most Times (much more than half the time)     During sexual intercourse, how often were you able to maintain your erection after you had penetrated (entered) your partner?  Most Times (much more than half the time)     During  sexual intercourse, how difficult was it to maintain your erection to completion of intercourse?  Slightly Difficult     When you attempted sexual intercourse, how often was it satisfactory for you?  Most Times (much more than half the time)       SHIM Total Score   SHIM  20        Score: 1-7 Severe ED 8-11 Moderate ED 12-16 Mild-Moderate ED 17-21 Mild ED 22-25 No ED   PMH: Past Medical History:  Diagnosis Date  . Dyspnea   . Hypertension    Unspecified  . Microscopic hematuria 03/2016    Surgical History: Past Surgical History:  Procedure Laterality Date  . CHOLECYSTECTOMY    . EXTRACORPOREAL SHOCK WAVE LITHOTRIPSY Left 04/10/2016   Procedure: EXTRACORPOREAL SHOCK WAVE LITHOTRIPSY (ESWL);  Surgeon: Hildred Laser, MD;  Location: [facility] ORS;  Service: Urology;  Laterality: Left;  . HEMORRHOID SURGERY  2000  . LASIK    . VASECTOMY  1999    Home Medications:  Allergies as of 03/09/2018      Reactions   Lisinopril Swelling   Swelling of the lips and dry mouth   Amlodipine Swelling   Swelling in extremeties more than mild, pt was not comfortable with that   Hydrochlorothiazide Swelling   Swelling of the foot, possibly contributed to developing gout, felt dehydrated, B/P not controlled   Ambien [zolpidem]    Very groggy and confused the morning after   Losartan Other (See Comments)   Weird dreams, ED, swelling      Medication List        Accurate as of 03/09/18  9:28 AM. Always use your most recent med list.          aspirin 81 MG tablet Take by mouth.   cholecalciferol 1000 units tablet Commonly known as:  VITAMIN D Take by mouth.   diltiazem 180 MG 24 hr capsule Commonly known as:  CARDIZEM CD Take 1 capsule (180 mg total) by mouth daily.   eszopiclone 2 MG Tabs tablet Commonly known as:  LUNESTA TAKE ONE TABLET BY MOUTH NIGHTLY AT BEDTIME   labetalol 200 MG tablet Commonly known as:  NORMODYNE Take 1 tablet (200 mg total) by mouth daily.     pravastatin 40 MG tablet Commonly known as:  PRAVACHOL TAKE 1 TABLET (40 MG TOTAL) BY  MOUTH DAILY.       Allergies:  Allergies  Allergen Reactions  . Lisinopril Swelling    Swelling of the lips and dry mouth  . Amlodipine Swelling    Swelling in extremeties more than mild, pt was not comfortable with that  . Hydrochlorothiazide Swelling    Swelling of the foot, possibly contributed to developing gout, felt dehydrated, B/P not controlled  . Ambien [Zolpidem]     Very groggy and confused the morning after  . Losartan Other (See Comments)    Weird dreams, ED, swelling    Family History: Family History  Problem Relation Age of Onset  . Breast cancer Mother   . Heart attack Father   . Lung cancer Father   . Cancer Other   . Kidney disease Neg Hx   . Prostate cancer Neg Hx   . Kidney cancer Neg Hx   . Bladder Cancer Neg Hx     Social History:  reports that  has never smoked. he has never used smokeless tobacco. He reports that he drinks alcohol. He reports that he does not use drugs.  ROS: UROLOGY Frequent Urination?: No Hard to postpone urination?: No Burning/pain with urination?: No Get up at night to urinate?: No Leakage of urine?: No Urine stream starts and stops?: No Trouble starting stream?: No Do you have to strain to urinate?: No Blood in urine?: No Urinary tract infection?: No Sexually transmitted disease?: No Injury to kidneys or bladder?: No Painful intercourse?: No Weak stream?: No Erection problems?: No Penile pain?: No  Gastrointestinal Nausea?: No Vomiting?: No Indigestion/heartburn?: No Diarrhea?: No Constipation?: No  Constitutional Fever: No Night sweats?: No Weight loss?: No Fatigue?: No  Skin Skin rash/lesions?: No Itching?: No  Eyes Blurred vision?: No Double vision?: No  Ears/Nose/Throat Sore throat?: No Sinus problems?: No  Hematologic/Lymphatic Swollen glands?: No Easy bruising?: No  Cardiovascular Leg  swelling?: No Chest pain?: No  Respiratory Cough?: No Shortness of breath?: No  Endocrine Excessive thirst?: No  Musculoskeletal Back pain?: No Joint pain?: No  Neurological Headaches?: No Dizziness?: No  Psychologic Depression?: No Anxiety?: No  Physical Exam: BP (!) 142/87   Pulse 80   Resp 16   Ht 5\' 8"  (1.727 m)   Wt 207 lb 11.2 oz (94.2 kg)   SpO2 96%   BMI 31.58 kg/m   Constitutional: Well nourished. Alert and oriented, No acute distress. HEENT: [place] AT, moist mucus membranes. Trachea midline, no masses. Cardiovascular: No clubbing, cyanosis, or edema. Respiratory: Normal respiratory effort, no increased work of breathing. GI: Abdomen is soft, non tender, non distended, no abdominal masses. Liver and spleen not palpable.  No hernias appreciated.  Stool sample for occult testing is not indicated.   GU: No CVA tenderness.  No bladder fullness or masses.  Patient with circumcised phallus.  Urethral meatus is patent.  No penile discharge. No penile lesions or rashes. Scrotum without lesions, cysts, rashes and/or edema.  Testicles are located scrotally bilaterally. No masses are appreciated in the testicles. Left and right epididymis are normal. Rectal: Patient with  normal sphincter tone. Anus and perineum without scarring or rashes. No rectal masses are appreciated. Prostate is approximately 50 grams, no nodules are appreciated. Seminal vesicles are normal. Skin: No rashes, bruises or suspicious lesions. Lymph: No cervical or inguinal adenopathy. Neurologic: Grossly intact, no focal deficits, moving all 4 extremities. Psychiatric: Normal mood and affect.  Laboratory Data: Lab Results  Component Value Date   WBC 4.4 03/04/2018  HGB 15.7 03/04/2018   HCT 46.6 03/04/2018   MCV 93 03/04/2018   PLT 288 03/04/2018    Lab Results  Component Value Date   CREATININE 1.06 03/04/2018    Lab Results  Component Value Date   PSA 1.0 01/17/2015   PSA  1.0 ng/mL on  02/20/2016 PSA  0.8 ng/mL on 02/19/2017 PSA  0.9 ng/mL on 03/02/2018  Lab Results  Component Value Date   TSH 2.400 03/04/2018       Component Value Date/Time   CHOL 252 (H) 03/04/2018 0929   HDL 48 03/04/2018 0929   CHOLHDL 4.9 02/20/2016 0950   LDLCALC 129 (H) 03/04/2018 0929    Lab Results  Component Value Date   AST 39 03/04/2018   Lab Results  Component Value Date   ALT 45 (H) 03/04/2018    Results for orders placed or performed in visit on 03/04/18  CBC with Differential/Platelet  Result Value Ref Range   WBC 4.4 3.4 - 10.8 x10E3/uL   RBC 5.02 4.14 - 5.80 x10E6/uL   Hemoglobin 15.7 13.0 - 17.7 g/dL   Hematocrit 16.1 09.6 - 51.0 %   MCV 93 79 - 97 fL   MCH 31.3 26.6 - 33.0 pg   MCHC 33.7 31.5 - 35.7 g/dL   RDW 04.5 40.9 - 81.1 %   Platelets 288 150 - 379 x10E3/uL   Neutrophils 34 Not Estab. %   Lymphs 50 Not Estab. %   Monocytes 12 Not Estab. %   Eos 3 Not Estab. %   Basos 1 Not Estab. %   Neutrophils Absolute 1.5 1.4 - 7.0 x10E3/uL   Lymphocytes Absolute 2.2 0.7 - 3.1 x10E3/uL   Monocytes Absolute 0.5 0.1 - 0.9 x10E3/uL   EOS (ABSOLUTE) 0.1 0.0 - 0.4 x10E3/uL   Basophils Absolute 0.0 0.0 - 0.2 x10E3/uL   Immature Granulocytes 0 Not Estab. %   Immature Grans (Abs) 0.0 0.0 - 0.1 x10E3/uL  Comprehensive metabolic panel  Result Value Ref Range   Glucose 104 (H) 65 - 99 mg/dL   BUN 11 6 - 24 mg/dL   Creatinine, Ser 9.14 0.76 - 1.27 mg/dL   GFR calc non Af Amer 77 >59 mL/min/1.73   GFR calc Af Amer 89 >59 mL/min/1.73   BUN/Creatinine Ratio 10 9 - 20   Sodium 142 134 - 144 mmol/L   Potassium 4.3 3.5 - 5.2 mmol/L   Chloride 100 96 - 106 mmol/L   CO2 27 20 - 29 mmol/L   Calcium 9.8 8.7 - 10.2 mg/dL   Total Protein 7.2 6.0 - 8.5 g/dL   Albumin 4.5 3.5 - 5.5 g/dL   Globulin, Total 2.7 1.5 - 4.5 g/dL   Albumin/Globulin Ratio 1.7 1.2 - 2.2   Bilirubin Total 0.3 0.0 - 1.2 mg/dL   Alkaline Phosphatase 37 (L) 39 - 117 IU/L   AST 39 0 - 40 IU/L   ALT 45 (H)  0 - 44 IU/L  Hemoglobin A1c  Result Value Ref Range   Hgb A1c MFr Bld 5.8 (H) 4.8 - 5.6 %   Est. average glucose Bld gHb Est-mCnc 120 mg/dL  Lipid Panel With LDL/HDL Ratio  Result Value Ref Range   Cholesterol, Total 252 (H) 100 - 199 mg/dL   Triglycerides 782 (H) 0 - 149 mg/dL   HDL 48 >95 mg/dL   VLDL Cholesterol Cal 75 (H) 5 - 40 mg/dL   LDL Calculated 621 (H) 0 - 99 mg/dL   LDl/HDL Ratio 2.7 0.0 -  3.6 ratio  TSH  Result Value Ref Range   TSH 2.400 0.450 - 4.500 uIU/mL  POCT urinalysis dipstick  Result Value Ref Range   Color, UA yellow    Clarity, UA clear    Glucose, UA neg    Bilirubin, UA neg    Ketones, UA neg    Spec Grav, UA 1.010 1.010 - 1.025   Blood, UA neg    pH, UA 5.0 5.0 - 8.0   Protein, UA neg    Urobilinogen, UA 0.2 0.2 or 1.0 E.U./dL   Nitrite, UA neg    Leukocytes, UA Negative Negative   Appearance     Odor    IFOBT POC (occult bld, rslt in office)  Result Value Ref Range   IFOBT Negative    Urinalysis Unremarkable.  See EPIC.   I have reviewed the labs.     Pertinent Imaging: CLINICAL DATA:  Left-sided kidney stones, some flank pain, follow-up  EXAM: ABDOMEN - 1 VIEW  COMPARISON:  KUB of 02/19/2017  FINDINGS: A supine film the abdomen and pelvis shows no definite opaque renal or ureteral calculi. The bowel gas pattern is nonspecific. No bony abnormality is seen.  IMPRESSION: No definite urinary calculi are seen by KUB.   Electronically Signed   By: Dwyane DeePaul  Barry M.D.   On: 03/04/2018 12:18 I have independently reviewed the films  Assessment & Plan:    1. History of renal stones  - KUB did not demonstrate any stones  - RTC in one year for KUB  - Advised to contact our office or seek treatment in the ED if becomes febrile or pain/ vomiting are difficult control in order to arrange for emergent/urgent intervention  2. History of hematuria  - UA today was negative - no further screening warranted   - report any gross  hematuria  3. BPH with LUTS  - IPSS score is 9/1, it is worsening  - Continue conservative management, avoiding bladder irritants and timed voiding's  - RTC in 12 months for IPSS, PSA and exam   4. Erectile dysfunction  - SHIM score is 20, it is stable  - RTC in 12 months for repeat SHIM score and exam    Return in about 1 year (around 03/10/2019) for IPSS, SHIM, PSA , KUB and exam.  These notes generated with voice recognition software. I apologize for typographical errors.  Michiel CowboySHANNON Jaqulyn Chancellor, PA-C   [facility] [address], [address] [place], [place] [address] [phone]

## 2018-03-09 ENCOUNTER — Telehealth: Payer: Self-pay

## 2018-03-09 ENCOUNTER — Ambulatory Visit (INDEPENDENT_AMBULATORY_CARE_PROVIDER_SITE_OTHER): Payer: BLUE CROSS/BLUE SHIELD | Admitting: Urology

## 2018-03-09 ENCOUNTER — Encounter: Payer: Self-pay | Admitting: Urology

## 2018-03-09 VITALS — BP 142/87 | HR 80 | Resp 16 | Ht 68.0 in | Wt 207.7 lb

## 2018-03-09 DIAGNOSIS — N138 Other obstructive and reflux uropathy: Secondary | ICD-10-CM | POA: Diagnosis not present

## 2018-03-09 DIAGNOSIS — Z87448 Personal history of other diseases of urinary system: Secondary | ICD-10-CM

## 2018-03-09 DIAGNOSIS — N529 Male erectile dysfunction, unspecified: Secondary | ICD-10-CM | POA: Diagnosis not present

## 2018-03-09 DIAGNOSIS — Z87442 Personal history of urinary calculi: Secondary | ICD-10-CM | POA: Diagnosis not present

## 2018-03-09 DIAGNOSIS — N401 Enlarged prostate with lower urinary tract symptoms: Secondary | ICD-10-CM | POA: Diagnosis not present

## 2018-03-09 LAB — URINALYSIS, COMPLETE
Bilirubin, UA: NEGATIVE
Glucose, UA: NEGATIVE
Ketones, UA: NEGATIVE
LEUKOCYTES UA: NEGATIVE
NITRITE UA: NEGATIVE
PH UA: 5.5 (ref 5.0–7.5)
Protein, UA: NEGATIVE
RBC, UA: NEGATIVE
Specific Gravity, UA: 1.025 (ref 1.005–1.030)
Urobilinogen, Ur: 0.2 mg/dL (ref 0.2–1.0)

## 2018-03-09 NOTE — Telephone Encounter (Signed)
Patient advised as directed below.  Thanks,  -Jimmye Wisnieski 

## 2018-03-09 NOTE — Telephone Encounter (Signed)
-----   Message from Maple Hudsonichard L Gilbert Jr., MD sent at 03/09/2018 11:06 AM EDT ----- Stable--continue to work on D and E

## 2018-03-14 ENCOUNTER — Other Ambulatory Visit: Payer: Self-pay | Admitting: Family Medicine

## 2018-03-21 ENCOUNTER — Other Ambulatory Visit: Payer: Self-pay | Admitting: Family Medicine

## 2018-03-22 ENCOUNTER — Telehealth: Payer: Self-pay | Admitting: Family Medicine

## 2018-03-22 DIAGNOSIS — Z789 Other specified health status: Secondary | ICD-10-CM

## 2018-03-22 MED ORDER — AZITHROMYCIN 250 MG PO TABS
ORAL_TABLET | ORAL | 0 refills | Status: DC
Start: 2018-03-22 — End: 2019-10-11

## 2018-03-22 NOTE — Telephone Encounter (Signed)
Pt requesting a z-pack to have on hand when they travel out of the country.  States he would like to have on just incase they get sick.

## 2018-03-22 NOTE — Telephone Encounter (Signed)
Ok thx.

## 2018-03-22 NOTE — Telephone Encounter (Signed)
Sent in. Pt informed.  

## 2018-06-07 DIAGNOSIS — S2096XA Insect bite (nonvenomous) of unspecified parts of thorax, initial encounter: Secondary | ICD-10-CM | POA: Diagnosis not present

## 2018-06-07 DIAGNOSIS — Z1283 Encounter for screening for malignant neoplasm of skin: Secondary | ICD-10-CM | POA: Diagnosis not present

## 2018-06-07 DIAGNOSIS — D485 Neoplasm of uncertain behavior of skin: Secondary | ICD-10-CM | POA: Diagnosis not present

## 2018-06-07 DIAGNOSIS — L720 Epidermal cyst: Secondary | ICD-10-CM | POA: Diagnosis not present

## 2018-06-19 ENCOUNTER — Other Ambulatory Visit: Payer: Self-pay | Admitting: Family Medicine

## 2018-08-17 ENCOUNTER — Other Ambulatory Visit: Payer: Self-pay | Admitting: Family Medicine

## 2018-09-06 DIAGNOSIS — G4733 Obstructive sleep apnea (adult) (pediatric): Secondary | ICD-10-CM | POA: Diagnosis not present

## 2018-09-08 ENCOUNTER — Ambulatory Visit (INDEPENDENT_AMBULATORY_CARE_PROVIDER_SITE_OTHER): Payer: BLUE CROSS/BLUE SHIELD | Admitting: Family Medicine

## 2018-09-08 DIAGNOSIS — Z23 Encounter for immunization: Secondary | ICD-10-CM | POA: Diagnosis not present

## 2018-09-08 NOTE — Progress Notes (Signed)
Flu shot only

## 2018-10-04 ENCOUNTER — Ambulatory Visit: Payer: Self-pay | Admitting: Family Medicine

## 2018-10-27 ENCOUNTER — Other Ambulatory Visit: Payer: Self-pay | Admitting: Family Medicine

## 2018-12-02 ENCOUNTER — Other Ambulatory Visit: Payer: Self-pay | Admitting: Family Medicine

## 2018-12-09 DIAGNOSIS — B349 Viral infection, unspecified: Secondary | ICD-10-CM | POA: Diagnosis not present

## 2018-12-09 DIAGNOSIS — J029 Acute pharyngitis, unspecified: Secondary | ICD-10-CM | POA: Diagnosis not present

## 2018-12-22 ENCOUNTER — Other Ambulatory Visit: Payer: Self-pay | Admitting: Family Medicine

## 2019-02-17 ENCOUNTER — Other Ambulatory Visit: Payer: Self-pay | Admitting: Family Medicine

## 2019-02-17 DIAGNOSIS — E7800 Pure hypercholesterolemia, unspecified: Secondary | ICD-10-CM

## 2019-03-07 ENCOUNTER — Telehealth: Payer: Self-pay | Admitting: Urology

## 2019-03-07 NOTE — Telephone Encounter (Signed)
Pt called to cancel appt and will call back to reschedule.  Just F.Y.I.

## 2019-03-09 ENCOUNTER — Other Ambulatory Visit: Payer: BLUE CROSS/BLUE SHIELD

## 2019-03-09 ENCOUNTER — Encounter: Payer: BLUE CROSS/BLUE SHIELD | Admitting: Family Medicine

## 2019-03-11 ENCOUNTER — Ambulatory Visit: Payer: BLUE CROSS/BLUE SHIELD | Admitting: Urology

## 2019-03-18 ENCOUNTER — Other Ambulatory Visit: Payer: Self-pay | Admitting: Family Medicine

## 2019-05-26 ENCOUNTER — Other Ambulatory Visit: Payer: Self-pay | Admitting: Family Medicine

## 2019-05-26 NOTE — Telephone Encounter (Signed)
Please review

## 2019-10-03 ENCOUNTER — Encounter: Payer: Self-pay | Admitting: Family Medicine

## 2019-10-03 ENCOUNTER — Other Ambulatory Visit: Payer: Self-pay

## 2019-10-03 ENCOUNTER — Ambulatory Visit (INDEPENDENT_AMBULATORY_CARE_PROVIDER_SITE_OTHER): Payer: BC Managed Care – PPO | Admitting: Family Medicine

## 2019-10-03 VITALS — BP 122/74 | HR 77 | Temp 96.9°F | Resp 18 | Wt 210.0 lb

## 2019-10-03 DIAGNOSIS — Z23 Encounter for immunization: Secondary | ICD-10-CM | POA: Diagnosis not present

## 2019-10-03 DIAGNOSIS — Z0000 Encounter for general adult medical examination without abnormal findings: Secondary | ICD-10-CM

## 2019-10-03 DIAGNOSIS — Z1211 Encounter for screening for malignant neoplasm of colon: Secondary | ICD-10-CM | POA: Diagnosis not present

## 2019-10-03 DIAGNOSIS — N200 Calculus of kidney: Secondary | ICD-10-CM | POA: Diagnosis not present

## 2019-10-03 LAB — POCT URINALYSIS DIPSTICK
Bilirubin, UA: NEGATIVE
Blood, UA: NEGATIVE
Glucose, UA: NEGATIVE
Ketones, UA: NEGATIVE
Leukocytes, UA: NEGATIVE
Nitrite, UA: NEGATIVE
Protein, UA: POSITIVE — AB
Spec Grav, UA: 1.015 (ref 1.010–1.025)
Urobilinogen, UA: NEGATIVE E.U./dL — AB
pH, UA: 6 (ref 5.0–8.0)

## 2019-10-03 NOTE — Progress Notes (Signed)
Patient: Roger CrewsDavid W Brazil Jr., Male    DOB: 10/03/1961, 58 y.o.   MRN: [id number] Visit Date: 10/03/2019  Today's Provider: Megan Mansichard Trek Kimball Jr, MD   Chief Complaint  Patient presents with  . Annual Exam   Subjective:     Annual physical exam Roger CrewsDavid W Langlois Jr. is a 58 y.o. male who presents today for health maintenance and complete physical. He feels well. He reports exercising weight lifting, golf, riding bikes. He reports he is sleeping poorly.  His wife died just a few months ago from pancreatic cancer.  He is coping fairly well.  He continues to work for [employer] but is working from home since the pandemic started.  -----------------------------------------------------------------   Review of Systems  Constitutional: Negative.   HENT: Negative.   Eyes: Negative.   Respiratory: Positive for apnea.   Cardiovascular: Negative.   Gastrointestinal: Negative.   Endocrine: Negative.   Genitourinary: Negative.   Musculoskeletal: Negative.   Skin: Negative.   Allergic/Immunologic: Negative.   Neurological: Negative.   Hematological: Negative.   Psychiatric/Behavioral: Negative.     Social History      He  reports that he has never smoked. He has never used smokeless tobacco. He reports current alcohol use. He reports that he does not use drugs.       Social History   Socioeconomic History  . Marital status: Married    Spouse name: Not on file  . Number of children: Not on file  . Years of education: Not on file  . Highest education level: Not on file  Occupational History  . Occupation: Full time  Social Needs  . Financial resource strain: Not on file  . Food insecurity    Worry: Not on file    Inability: Not on file  . Transportation needs    Medical: Not on file    Non-medical: Not on file  Tobacco Use  . Smoking status: Never Smoker  . Smokeless tobacco: Never Used  Substance and Sexual Activity  . Alcohol use: Yes    Comment: occasionally  . Drug  use: No  . Sexual activity: Not on file  Lifestyle  . Physical activity    Days per week: Not on file    Minutes per session: Not on file  . Stress: Not on file  Relationships  . Social Musicianconnections    Talks on phone: Not on file    Gets together: Not on file    Attends religious service: Not on file    Active member of club or organization: Not on file    Attends meetings of clubs or organizations: Not on file    Relationship status: Not on file  Other Topics Concern  . Not on file  Social History Narrative   Pt gets regular exercise.    Past Medical History:  Diagnosis Date  . Dyspnea   . Hypertension    Unspecified  . Microscopic hematuria 03/2016     Patient Active Problem List   Diagnosis Date Noted  . Apnea, sleep 10/14/2016  . Snoring 06/10/2016  . Kidney stones 05/01/2016  . Microscopic hematuria 05/01/2016  . Ureteral stone 05/01/2016  . Failure of erection 06/21/2015  . Brash 06/21/2015  . Essential (primary) hypertension 06/21/2015  . HLD (hyperlipidemia) 06/21/2015  . Hypertrophy of salivary gland 06/21/2015  . Restless leg 06/21/2015  . Avitaminosis D 06/21/2015  . Essential hypertension 05/28/2009  . DYSPNEA 05/28/2009  Past Surgical History:  Procedure Laterality Date  . CHOLECYSTECTOMY    . EXTRACORPOREAL SHOCK WAVE LITHOTRIPSY Left 04/10/2016   Procedure: EXTRACORPOREAL SHOCK WAVE LITHOTRIPSY (ESWL);  Surgeon: Nickie Retort, MD;  Location: [facility] ORS;  Service: Urology;  Laterality: Left;  . HEMORRHOID SURGERY  2000  . LASIK    . [place] History        Family Status  Relation Name Status  . Mother  Deceased at age 40  . Father  Deceased at age 47  . Sister  Deceased  . Brother  Alive  . Sister  Alive  . Sister  Alive  . Sister  Alive  . Brother  Alive  . Son  Alive  . Son  Alive  . Other  (Not Specified)  . Neg Hx  (Not Specified)        His family history includes Breast cancer in his mother; Cancer in an  other family member; Heart attack in his father; Lung cancer in his father. There is no history of Kidney disease, Prostate cancer, Kidney cancer, or Bladder Cancer.      Allergies  Allergen Reactions  . Lisinopril Swelling    Swelling of the lips and dry mouth  . Amlodipine Swelling    Swelling in extremeties more than mild, pt was not comfortable with that  . Hydrochlorothiazide Swelling    Swelling of the foot, possibly contributed to developing gout, felt dehydrated, B/P not controlled  . Ambien [Zolpidem]     Very groggy and confused the morning after  . Losartan Other (See Comments)    Weird dreams, ED, swelling     Current Outpatient Medications:  .  aspirin 81 MG tablet, Take by mouth., Disp: , Rfl:  .  cholecalciferol (VITAMIN D) 1000 UNITS tablet, Take by mouth., Disp: , Rfl:  .  diltiazem (CARDIZEM CD) 180 MG 24 hr capsule, TAKE 1 CAPSULE BY MOUTH EVERY DAY, Disp: 30 capsule, Rfl: 12 .  eszopiclone (LUNESTA) 2 MG TABS tablet, TAKE 1 TABLET BY MOUTH EVERYDAY AT BEDTIME, Disp: 30 tablet, Rfl: 5 .  labetalol (NORMODYNE) 200 MG tablet, TAKE 1 TABLET BY MOUTH EVERY DAY, Disp: 30 tablet, Rfl: 9 .  pravastatin (PRAVACHOL) 40 MG tablet, TAKE 1 TABLET BY MOUTH EVERY DAY, Disp: 30 tablet, Rfl: 11 .  rOPINIRole (REQUIP) 2 MG tablet, TAKE 1/2 TO 1 TABLET BY MOUTH AT BEDTIME, Disp: 30 tablet, Rfl: 5 .  azithromycin (ZITHROMAX) 250 MG tablet, Take 2 tablets on the first day then 1 daily until finished, Disp: 6 each, Rfl: 0 .  diltiazem (CARDIZEM CD) 180 MG 24 hr capsule, Take 1 capsule (180 mg total) by mouth daily., Disp: 30 capsule, Rfl: 12   Patient Care Team: Jerrol Banana., MD as PCP - General (Family Medicine)    Objective:    Vitals: BP 122/74 (BP Location: Right Arm, Patient Position: Sitting, Cuff Size: Large)   Pulse 77   Temp (!) 96.9 F (36.1 C) (Temporal)   Resp 18   Wt 210 lb (95.3 kg)   SpO2 99%   BMI 31.93 kg/m    Vitals:   10/03/19 1408  BP: 122/74   Pulse: 77  Resp: 18  Temp: (!) 96.9 F (36.1 C)  TempSrc: Temporal  SpO2: 99%  Weight: 210 lb (95.3 kg)     Physical Exam Vitals signs reviewed.  Constitutional:      Appearance: He is well-developed.  HENT:  Head: Normocephalic and atraumatic.     Right Ear: External ear normal.     Left Ear: External ear normal.     Nose: Nose normal.  Eyes:     Conjunctiva/sclera: Conjunctivae normal.     Pupils: Pupils are equal, round, and reactive to light.  Neck:     Musculoskeletal: Normal range of motion and neck supple.  Cardiovascular:     Rate and Rhythm: Normal rate and regular rhythm.     Heart sounds: Normal heart sounds.  Pulmonary:     Effort: Pulmonary effort is normal.     Breath sounds: Normal breath sounds.  Abdominal:     General: Bowel sounds are normal.     Palpations: Abdomen is soft.  Genitourinary:    Penis: Normal.      Scrotum/Testes: Normal.  Musculoskeletal: Normal range of motion.  Skin:    General: Skin is warm and dry.  Neurological:     General: No focal deficit present.     Mental Status: He is alert and oriented to person, place, and time.  Psychiatric:        Mood and Affect: Mood normal.        Behavior: Behavior normal.        Thought Content: Thought content normal.        Judgment: Judgment normal.      Depression Screen PHQ 2/9 Scores 10/03/2019 10/03/2019 03/04/2018 02/24/2017  PHQ - 2 Score 1 1 0 0  PHQ- 9 Score 1 - 0 0       Assessment & Plan:     Routine Health Maintenance and Physical Exam  Exercise Activities and Dietary recommendations Goals   None     Immunization History  Administered Date(s) Administered  . Influenza,inj,Quad PF,6+ Mos 08/25/2017, 09/08/2018  . Influenza-Unspecified 01/29/2017  . Tdap 08/13/2007    Health Maintenance  Topic Date Due  . Hepatitis C Screening  10/04/1961  . HIV Screening  07/26/1974  . TETANUS/TDAP  08/12/2017  . INFLUENZA VACCINE  07/23/2019  . COLONOSCOPY   09/27/2019     Discussed health benefits of physical activity, and encouraged him to engage in regular exercise appropriate for his age and condition.   1. Annual physical exam Overall good health. - POCT urinalysis dipstick - PSA - TSH - Comp. Metabolic Panel (12) - Lipid Profile - CBC w/Diff/Platelet  2. Need for immunization against influenza  - Flu Vaccine QUAD 36+ mos IM  3. Kidney stones Refer back to urology for history of kidney stones - Ambulatory referral to Urology  4. Colon cancer screening Last colonoscopy 2010, time for follow-up. - Ambulatory referral to Gastroenterology  --------------------------------------------------------------------    Megan Mans, MD  [facility]

## 2019-10-04 LAB — CBC WITH DIFFERENTIAL/PLATELET
Basophils Absolute: 0.1 10*3/uL (ref 0.0–0.2)
Basos: 2 %
EOS (ABSOLUTE): 0.1 10*3/uL (ref 0.0–0.4)
Eos: 3 %
Hematocrit: 48.1 % (ref 37.5–51.0)
Hemoglobin: 16.6 g/dL (ref 13.0–17.7)
Immature Grans (Abs): 0 10*3/uL (ref 0.0–0.1)
Immature Granulocytes: 1 %
Lymphocytes Absolute: 1.9 10*3/uL (ref 0.7–3.1)
Lymphs: 43 %
MCH: 30.9 pg (ref 26.6–33.0)
MCHC: 34.5 g/dL (ref 31.5–35.7)
MCV: 90 fL (ref 79–97)
Monocytes Absolute: 0.6 10*3/uL (ref 0.1–0.9)
Monocytes: 13 %
Neutrophils Absolute: 1.7 10*3/uL (ref 1.4–7.0)
Neutrophils: 38 %
Platelets: 220 10*3/uL (ref 150–450)
RBC: 5.37 x10E6/uL (ref 4.14–5.80)
RDW: 12.9 % (ref 11.6–15.4)
WBC: 4.4 10*3/uL (ref 3.4–10.8)

## 2019-10-04 LAB — COMP. METABOLIC PANEL (12)
AST: 36 IU/L (ref 0–40)
Albumin/Globulin Ratio: 2.1 (ref 1.2–2.2)
Albumin: 5 g/dL — ABNORMAL HIGH (ref 3.8–4.9)
Alkaline Phosphatase: 35 IU/L — ABNORMAL LOW (ref 39–117)
BUN/Creatinine Ratio: 10 (ref 10–24)
BUN: 13 mg/dL (ref 8–27)
Bilirubin Total: 0.6 mg/dL (ref 0.0–1.2)
Calcium: 10.3 mg/dL — ABNORMAL HIGH (ref 8.6–10.2)
Chloride: 100 mmol/L (ref 96–106)
Creatinine, Ser: 1.3 mg/dL — ABNORMAL HIGH (ref 0.76–1.27)
GFR calc Af Amer: 69 mL/min/{1.73_m2} (ref >59)
GFR calc non Af Amer: 59 mL/min/{1.73_m2} — ABNORMAL LOW (ref >59)
Globulin, Total: 2.4 g/dL (ref 1.5–4.5)
Glucose: 100 mg/dL — ABNORMAL HIGH (ref 65–99)
Potassium: 4.4 mmol/L (ref 3.5–5.2)
Sodium: 139 mmol/L (ref 134–144)
Total Protein: 7.4 g/dL (ref 6.0–8.5)

## 2019-10-04 LAB — LIPID PANEL
Chol/HDL Ratio: 5.4 ratio — ABNORMAL HIGH (ref 0.0–5.0)
Cholesterol, Total: 257 mg/dL — ABNORMAL HIGH (ref 100–199)
HDL: 48 mg/dL (ref >39)
LDL Chol Calc (NIH): 165 mg/dL — ABNORMAL HIGH (ref 0–99)
Triglycerides: 236 mg/dL — ABNORMAL HIGH (ref 0–149)
VLDL Cholesterol Cal: 44 mg/dL — ABNORMAL HIGH (ref 5–40)

## 2019-10-04 LAB — PSA: Prostate Specific Ag, Serum: 0.8 ng/mL (ref 0.0–4.0)

## 2019-10-04 LAB — TSH: TSH: 1.51 u[IU]/mL (ref 0.450–4.500)

## 2019-10-06 ENCOUNTER — Telehealth: Payer: Self-pay | Admitting: *Deleted

## 2019-10-06 NOTE — Telephone Encounter (Signed)
Patient was advised.  

## 2019-10-06 NOTE — Telephone Encounter (Signed)
He does not need the aspirin

## 2019-10-06 NOTE — Telephone Encounter (Signed)
Patient was notified of results. Expressed understanding. Scheduled follow up for 01/02/2020 @8 :00 am.

## 2019-10-06 NOTE — Telephone Encounter (Signed)
Patient wanted to know if he should continue to take aspirin 81 mg, since his kidney function was decreased? Please advise?

## 2019-10-06 NOTE — Telephone Encounter (Signed)
-----   Message from Jerrol Banana., MD sent at 10/06/2019  1:15 PM EDT ----- Cholesterol still moderately elevated.  Mild decrease in kidney function.  Push fluids and avoid any anti-inflammatory drugs.  Let us see you back after the first of the year to recheck this to make sure it is stable.  Stop any vitamins also that might have calcium supplements .  Calcium borderline high.  We will followed up with that on the next visit too

## 2019-10-10 ENCOUNTER — Other Ambulatory Visit: Payer: Self-pay

## 2019-10-10 DIAGNOSIS — Z1211 Encounter for screening for malignant neoplasm of colon: Secondary | ICD-10-CM

## 2019-10-10 MED ORDER — NA SULFATE-K SULFATE-MG SULF 17.5-3.13-1.6 GM/177ML PO SOLN: 1.0000 | Freq: Once | ORAL | 0 refills | Status: AC

## 2019-10-11 ENCOUNTER — Encounter: Payer: Self-pay | Admitting: *Deleted

## 2019-10-11 ENCOUNTER — Other Ambulatory Visit: Payer: Self-pay

## 2019-10-13 ENCOUNTER — Ambulatory Visit
Admission: RE | Admit: 2019-10-13 | Discharge: 2019-10-13 | Disposition: A | Discharge status: Home / Self Care | Payer: BC Managed Care – PPO | Source: Ambulatory Visit | Attending: Urology | Admitting: Urology

## 2019-10-13 ENCOUNTER — Other Ambulatory Visit: Payer: Self-pay | Admitting: Family Medicine

## 2019-10-13 ENCOUNTER — Ambulatory Visit
Admission: RE | Admit: 2019-10-13 | Discharge: 2019-10-13 | Disposition: A | Discharge status: Home / Self Care | Payer: BC Managed Care – PPO | Attending: Urology | Admitting: Urology

## 2019-10-13 ENCOUNTER — Other Ambulatory Visit: Payer: Self-pay

## 2019-10-13 ENCOUNTER — Ambulatory Visit: Payer: BC Managed Care – PPO | Admitting: Urology

## 2019-10-13 VITALS — BP 150/93 | HR 76 | Ht 69.0 in | Wt 200.0 lb

## 2019-10-13 DIAGNOSIS — N200 Calculus of kidney: Secondary | ICD-10-CM

## 2019-10-13 LAB — URINALYSIS, COMPLETE
Bilirubin, UA: NEGATIVE
Glucose, UA: NEGATIVE
Ketones, UA: NEGATIVE
Leukocytes,UA: NEGATIVE
Nitrite, UA: NEGATIVE
Protein,UA: NEGATIVE
RBC, UA: NEGATIVE
Specific Gravity, UA: 1.025 (ref 1.005–1.030)
Urobilinogen, Ur: 0.2 mg/dL (ref 0.2–1.0)
pH, UA: 5 (ref 5.0–7.5)

## 2019-10-13 LAB — MICROSCOPIC EXAMINATION
Bacteria, UA: NONE SEEN
RBC, Urine: NONE SEEN /hpf (ref 0–2)

## 2019-10-13 NOTE — Progress Notes (Signed)
   10/13/2019 1:25 PM   Roger Chang. [birth year] [id number]  Reason for visit: Follow up nephrolithiasis  HPI: I saw Mr. Roger Chang in urology clinic for follow-up of nephrolithiasis.  He was previously followed by Dr. Pilar Jarvis and Zara Council, PA.  He is a healthy 58 year old man with a history of a single stone episode in 2017 that was treated with shockwave lithotripsy.  He denies any recent complaints including gross hematuria or flank pain.  Urinalysis is benign today.  Most recent imaging was KUB on 03/04/2018 which showed no evidence of stones.   ROS: Please see flowsheet from today's date for complete review of systems.  Physical Exam: BP (!) 150/93   Pulse 76   Ht 5\' 9"  (1.753 m)   Wt 200 lb (90.7 kg)   BMI 29.53 kg/m    Constitutional:  Alert and oriented, No acute distress. Respiratory: Normal respiratory effort, no increased work of breathing. GI: Abdomen is soft, nontender, nondistended, no abdominal masses GU: No CVA tenderness Skin: No rashes, bruises or suspicious lesions. Neurologic: Grossly intact, no focal deficits, moving all 4 extremities. Psychiatric: Normal mood and affect  Laboratory Data: Urinalysis today 0-5 WBCs, 0 RBCs, no bacteria, no crystals, nitrite negative  Pertinent Imaging: No recent imaging to review  Assessment & Plan:   In summary, the patient is an asymptomatic 58 year old male with a history of a single stone in 2017 treated treated with shockwave lithotripsy.  We discussed general stone prevention strategies including adequate hydration with goal of producing 2.5 L of urine daily, increasing citric acid intake, increasing calcium intake during high oxalate meals, minimizing animal protein, and decreasing salt intake. Information about dietary recommendations given today.    KUB today, will call with results RTC on year for KUB and routine stone surveillance  A total of 15 minutes were spent face-to-face with the patient, greater  than 50% was spent in patient education, counseling, and coordination of care regarding nephrolithiasis and prevention.  Billey Co, [facility] [address], [place], [place] [address] [phone]

## 2019-10-14 ENCOUNTER — Other Ambulatory Visit
Admission: RE | Admit: 2019-10-14 | Discharge: 2019-10-14 | Disposition: A | Discharge status: Home / Self Care | Payer: BC Managed Care – PPO | Source: Ambulatory Visit | Attending: Gastroenterology | Admitting: Gastroenterology

## 2019-10-14 ENCOUNTER — Telehealth: Payer: Self-pay

## 2019-10-14 DIAGNOSIS — Z20828 Contact with and (suspected) exposure to other viral communicable diseases: Secondary | ICD-10-CM | POA: Insufficient documentation

## 2019-10-14 DIAGNOSIS — Z01812 Encounter for preprocedural laboratory examination: Secondary | ICD-10-CM | POA: Insufficient documentation

## 2019-10-14 LAB — SARS CORONAVIRUS 2 (TAT 6-24 HRS): SARS Coronavirus 2: NEGATIVE

## 2019-10-14 NOTE — Telephone Encounter (Addendum)
Called patient to confirm patient procedure appointment and to remind patient that they have to go for a COVID test on 10/14/2019.Called patient and left a message for call back to remind him to go for covid testing today

## 2019-10-14 NOTE — Telephone Encounter (Signed)
mychart notification sent 

## 2019-10-14 NOTE — Telephone Encounter (Signed)
-----   Message from Billey Co, MD sent at 10/14/2019  1:37 PM EDT ----- No kidney stones seen on KUB, keep one year follow up  Nickolas Madrid, MD 10/14/2019

## 2019-10-15 ENCOUNTER — Other Ambulatory Visit: Payer: Self-pay | Admitting: Family Medicine

## 2019-10-18 ENCOUNTER — Other Ambulatory Visit: Payer: Self-pay

## 2019-10-18 ENCOUNTER — Ambulatory Visit
Admission: RE | Admit: 2019-10-18 | Discharge: 2019-10-18 | Disposition: A | Discharge status: Home / Self Care | Payer: BC Managed Care – PPO | Attending: Gastroenterology | Admitting: Gastroenterology

## 2019-10-18 ENCOUNTER — Ambulatory Visit: Payer: BC Managed Care – PPO | Admitting: Anesthesiology

## 2019-10-18 ENCOUNTER — Encounter
Admission: RE | Disposition: A | Discharge status: Home / Self Care | Payer: Self-pay | Source: Home / Self Care | Attending: Gastroenterology

## 2019-10-18 DIAGNOSIS — E785 Hyperlipidemia, unspecified: Secondary | ICD-10-CM | POA: Diagnosis not present

## 2019-10-18 DIAGNOSIS — I10 Essential (primary) hypertension: Secondary | ICD-10-CM | POA: Diagnosis not present

## 2019-10-18 DIAGNOSIS — G4730 Sleep apnea, unspecified: Secondary | ICD-10-CM | POA: Insufficient documentation

## 2019-10-18 DIAGNOSIS — Z7982 Long term (current) use of aspirin: Secondary | ICD-10-CM | POA: Insufficient documentation

## 2019-10-18 DIAGNOSIS — K5730 Diverticulosis of large intestine without perforation or abscess without bleeding: Secondary | ICD-10-CM | POA: Insufficient documentation

## 2019-10-18 DIAGNOSIS — Z79899 Other long term (current) drug therapy: Secondary | ICD-10-CM | POA: Insufficient documentation

## 2019-10-18 DIAGNOSIS — Z1211 Encounter for screening for malignant neoplasm of colon: Secondary | ICD-10-CM

## 2019-10-18 HISTORY — DX: Sleep apnea, unspecified: G47.30

## 2019-10-18 HISTORY — PX: COLONOSCOPY WITH PROPOFOL: SHX5780

## 2019-10-18 SURGERY — COLONOSCOPY WITH PROPOFOL
Anesthesia: General | Site: Rectum

## 2019-10-18 MED ORDER — LACTATED RINGERS IV SOLN
INTRAVENOUS | Status: DC
  Administered 2019-10-18: 09:00:00 via INTRAVENOUS

## 2019-10-18 MED ORDER — STERILE WATER FOR IRRIGATION IR SOLN
Status: DC | PRN
  Administered 2019-10-18: .05 mL

## 2019-10-18 MED ORDER — PROPOFOL 10 MG/ML IV BOLUS
INTRAVENOUS | Status: DC | PRN
  Administered 2019-10-18: 70 mg via INTRAVENOUS
  Administered 2019-10-18: 40 mg via INTRAVENOUS
  Administered 2019-10-18: 30 mg via INTRAVENOUS
  Administered 2019-10-18: 40 mg via INTRAVENOUS

## 2019-10-18 MED ORDER — ONDANSETRON HCL 4 MG/2ML IJ SOLN: 4.0000 mg | Freq: Once | INTRAMUSCULAR | Status: DC | PRN

## 2019-10-18 MED ORDER — LIDOCAINE HCL (CARDIAC) PF 100 MG/5ML IV SOSY
PREFILLED_SYRINGE | INTRAVENOUS | Status: DC | PRN
  Administered 2019-10-18: 60 mg via INTRAVENOUS

## 2019-10-18 SURGICAL SUPPLY — 5 items
CANISTER SUCT 1200ML W/VALVE (MISCELLANEOUS) ×2 IMPLANT
GOWN CVR UNV OPN BCK APRN NK (MISCELLANEOUS) ×2 IMPLANT
GOWN ISOL THUMB LOOP REG UNIV (MISCELLANEOUS) ×2
KIT ENDO PROCEDURE OLY (KITS) ×2 IMPLANT
WATER STERILE IRR 250ML POUR (IV SOLUTION) ×2 IMPLANT

## 2019-10-18 NOTE — Anesthesia Preprocedure Evaluation (Addendum)
Anesthesia Evaluation  Patient identified by MRN, date of birth, ID band Patient awake    Reviewed: Allergy & Precautions, NPO status , Patient's Chart, lab work & pertinent test results  Airway Mallampati: II  TM Distance: >3 FB     Dental   Pulmonary sleep apnea and Continuous Positive Airway Pressure Ventilation ,    breath sounds clear to auscultation       Cardiovascular hypertension,  Rhythm:Regular Rate:Normal  HLD   Neuro/Psych    GI/Hepatic   Endo/Other    Renal/GU      Musculoskeletal   Abdominal   Peds  Hematology   Anesthesia Other Findings   Reproductive/Obstetrics                             Anesthesia Physical Anesthesia Plan  ASA: II  Anesthesia Plan: General   Post-op Pain Management:    Induction: Intravenous  PONV Risk Score and Plan: TIVA  Airway Management Planned: Nasal Cannula and Natural Airway  Additional Equipment:   Intra-op Plan:   Post-operative Plan:   Informed Consent: I have reviewed the patients History and Physical, chart, labs and discussed the procedure including the risks, benefits and alternatives for the proposed anesthesia with the patient or authorized representative who has indicated his/her understanding and acceptance.       Plan Discussed with: CRNA  Anesthesia Plan Comments:         Anesthesia Quick Evaluation

## 2019-10-18 NOTE — Op Note (Signed)
[facility] Patient Name: Surafel Hilleary Procedure Date: 10/18/2019 9:54 AM MRN: [id number] Account #: [id number] Date of Birth: 1961/07/22 Admit Type: Outpatient Age: 58 Room: [facility] OR ROOM 01 Gender: Male Note Status: Finalized Procedure:            Colonoscopy Indications:          Screening for colorectal malignant neoplasm Providers:            Floris Neuhaus B. Maximino Greenland MD, MD Referring MD:         Ferdinand Lango. Sullivan Lone, MD (Referring MD) Medicines:            Monitored Anesthesia Care Complications:        No immediate complications. Procedure:            Pre-Anesthesia Assessment:                       - Prior to the procedure, a History and Physical was                        performed, and patient medications, allergies and                        sensitivities were reviewed. The patient's tolerance of                        previous anesthesia was reviewed.                       - The risks and benefits of the procedure and the                        sedation options and risks were discussed with the                        patient. All questions were answered and informed                        consent was obtained.                       - Patient identification and proposed procedure were                        verified prior to the procedure by the physician, the                        nurse, the anesthetist and the technician. The                        procedure was verified in the pre-procedure area in the                        procedure room in the endoscopy suite.                       - ASA Grade Assessment: II - A patient with mild                        systemic disease.                       -  After reviewing the risks and benefits, the patient                        was deemed in satisfactory condition to undergo the                        procedure.                       After obtaining informed consent, the colonoscope was              passed under direct vision. Throughout the procedure,                        the patient's blood pressure, pulse, and oxygen                        saturations were monitored continuously. The                        Colonoscope was introduced through the anus and                        advanced to the the cecum, identified by appendiceal                        orifice and ileocecal valve. The colonoscopy was                        performed with ease. The patient tolerated the                        procedure well. The quality of the bowel preparation                        was good. Findings:      The perianal and digital rectal examinations were normal.      A few diverticula were found in the sigmoid colon.      The exam was otherwise without abnormality.      The rectum, sigmoid colon, descending colon, transverse colon, ascending       colon and cecum appeared normal.      The retroflexed view of the distal rectum and anal verge was normal and       showed no anal or rectal abnormalities. Impression:           - Diverticulosis in the sigmoid colon.                       - The examination was otherwise normal.                       - The rectum, sigmoid colon, descending colon,                        transverse colon, ascending colon and cecum are normal.                       - The distal rectum and anal verge are normal on  retroflexion view.                       - No specimens collected. Recommendation:       - Discharge patient to home.                       - Resume previous diet.                       - Continue present medications.                       - Repeat colonoscopy in 10 years for screening purposes.                       - Return to primary care physician as previously                        scheduled.                       - The findings and recommendations were discussed with                        the patient.                        - The findings and recommendations were discussed with                        the patient's family.                       - High fiber diet. Procedure Code(s):    --- Professional ---                       Z6109G0121, Colorectal cancer screening; colonoscopy on                        individual not meeting criteria for high risk Diagnosis Code(s):    --- Professional ---                       Z12.11, Encounter for screening for malignant neoplasm                        of colon CPT copyright 2019 American Medical Association. All rights reserved. The codes documented in this report are preliminary and upon coder review may  be revised to meet current compliance requirements.  Melodie BouillonVarnita Amil Moseman, MD Michel BickersVarnita B. Maximino Greenlandahiliani MD, MD 10/18/2019 10:31:09 AM This report has been signed electronically. Number of Addenda: 0 Note Initiated On: 10/18/2019 9:54 AM Scope Withdrawal Time: 0 hours 14 minutes 13 seconds  Total Procedure Duration: 0 hours 19 minutes 3 seconds       [facility]

## 2019-10-18 NOTE — H&P (Signed)
Melodie Bouillon, MD [address], [address], [place], [place], [address] [address], [place], [place], [address] Phone: [phone]  Fax: [phone]  Primary Care Physician:  Maple Hudson., MD   Pre-Procedure History & Physical: HPI:  Roger Quam. is a 58 y.o. male is here for a colonoscopy.   Past Medical History:  Diagnosis Date  . Dyspnea   . Hypertension    Unspecified  . Microscopic hematuria 03/2016  . Sleep apnea    CPAP    Past Surgical History:  Procedure Laterality Date  . CHOLECYSTECTOMY    . EXTRACORPOREAL SHOCK WAVE LITHOTRIPSY Left 04/10/2016   Procedure: EXTRACORPOREAL SHOCK WAVE LITHOTRIPSY (ESWL);  Surgeon: Hildred Laser, MD;  Location: [facility] ORS;  Service: Urology;  Laterality: Left;  . HEMORRHOID SURGERY  2000  . LASIK    . VASECTOMY  1999    Prior to Admission medications   Medication Sig Start Date End Date Taking? Authorizing Provider  aspirin 81 MG tablet Take by mouth. 03/18/12  Yes [provider]  cholecalciferol (VITAMIN D) 1000 UNITS tablet Take by mouth. 01/15/12  Yes [provider]  diltiazem (CARDIZEM CD) 180 MG 24 hr capsule TAKE 1 CAPSULE BY MOUTH EVERY DAY 10/27/18  Yes Maple Hudson., MD  eszopiclone Alfonso Patten) 2 MG TABS tablet TAKE 1 TABLET BY MOUTH EVERYDAY AT BEDTIME 10/13/19  Yes Maple Hudson., MD  labetalol (NORMODYNE) 200 MG tablet TAKE 1 TABLET BY MOUTH EVERY DAY 10/16/19  Yes Maple Hudson., MD  pravastatin (PRAVACHOL) 40 MG tablet TAKE 1 TABLET BY MOUTH EVERY DAY 02/17/19  Yes Maple Hudson., MD  rOPINIRole (REQUIP) 2 MG tablet TAKE 1/2 TO 1 TABLET BY MOUTH AT BEDTIME 05/26/19  Yes Maple Hudson., MD    Allergies as of 10/10/2019 - Review Complete 10/03/2019  Allergen Reaction Noted  . Lisinopril Swelling 08/15/2015  . Amlodipine Swelling 08/15/2015  . Hydrochlorothiazide Swelling 08/15/2015  . Ambien [zolpidem]  06/15/2017  . Losartan  Other (See Comments) 08/15/2015    Family History  Problem Relation Age of Onset  . Breast cancer Mother   . Heart attack Father   . Lung cancer Father   . Cancer Other   . Kidney disease Neg Hx   . Prostate cancer Neg Hx   . Kidney cancer Neg Hx   . Bladder Cancer Neg Hx     Social History   Socioeconomic History  . Marital status: Widowed    Spouse name: Not on file  . Number of children: Not on file  . Years of education: Not on file  . Highest education level: Not on file  Occupational History  . Occupation: Full time  Social Needs  . Financial resource strain: Not on file  . Food insecurity    Worry: Not on file    Inability: Not on file  . Transportation needs    Medical: Not on file    Non-medical: Not on file  Tobacco Use  . Smoking status: Never Smoker  . Smokeless tobacco: Never Used  Substance and Sexual Activity  . Alcohol use: Yes    Alcohol/week: 6.0 standard drinks    Types: 6 Glasses of wine per week    Comment: occasionally  . Drug use: No  . Sexual activity: Not on file  Lifestyle  . Physical activity    Days per week: Not on file    Minutes per session: Not  on file  . Stress: Not on file  Relationships  . Social Herbalist on phone: Not on file    Gets together: Not on file    Attends religious service: Not on file    Active member of club or organization: Not on file    Attends meetings of clubs or organizations: Not on file    Relationship status: Not on file  . Intimate partner violence    Fear of current or ex partner: Not on file    Emotionally abused: Not on file    Physically abused: Not on file    Forced sexual activity: Not on file  Other Topics Concern  . Not on file  Social History Narrative   Pt gets regular exercise.    Review of Systems: See HPI, otherwise negative ROS  Physical Exam: BP 130/85   Pulse 73   Temp (!) 97.3 F (36.3 C)   Resp 16   Ht 5\' 9"  (1.753 m)   Wt 95.7 kg   SpO2 100%   BMI  31.16 kg/m  General:   Alert,  pleasant and cooperative in NAD Head:  Normocephalic and atraumatic. Neck:  Supple; no masses or thyromegaly. Lungs:  Clear throughout to auscultation, normal respiratory effort.    Heart:  +S1, +S2, Regular rate and rhythm, No edema. Abdomen:  Soft, nontender and nondistended. Normal bowel sounds, without guarding, and without rebound.   Neurologic:  Alert and  oriented x4;  grossly normal neurologically.  Impression/Plan: Roger Lerner. is here for a colonoscopy to be performed for average risk screening.  Risks, benefits, limitations, and alternatives regarding  colonoscopy have been reviewed with the patient.  Questions have been answered.  All parties agreeable.   Roger Manifold, MD  10/18/2019, 9:51 AM

## 2019-10-18 NOTE — Discharge Instructions (Signed)

## 2019-10-18 NOTE — Anesthesia Postprocedure Evaluation (Signed)
Anesthesia Post Note  Patient: Roger Chang.  Procedure(s) Performed: COLONOSCOPY WITH PROPOFOL (N/A Rectum)  Patient location during evaluation: PACU Anesthesia Type: General Level of consciousness: awake Pain management: pain level controlled Vital Signs Assessment: post-procedure vital signs reviewed and stable Respiratory status: respiratory function stable Cardiovascular status: stable Postop Assessment: no signs of nausea or vomiting Anesthetic complications: no    Veda Canning

## 2019-10-18 NOTE — Transfer of Care (Signed)
Immediate Anesthesia Transfer of Care Note  Patient: Roger Chang.  Procedure(s) Performed: COLONOSCOPY WITH PROPOFOL (N/A Rectum)  Patient Location: PACU  Anesthesia Type: General  Level of Consciousness: awake, alert  and patient cooperative  Airway and Oxygen Therapy: Patient Spontanous Breathing and Patient connected to supplemental oxygen  Post-op Assessment: Post-op Vital signs reviewed, Patient's Cardiovascular Status Stable, Respiratory Function Stable, Patent Airway and No signs of Nausea or vomiting  Post-op Vital Signs: Reviewed and stable  Complications: No apparent anesthesia complications

## 2019-10-19 ENCOUNTER — Encounter: Payer: Self-pay | Admitting: Gastroenterology

## 2019-10-21 DIAGNOSIS — G4733 Obstructive sleep apnea (adult) (pediatric): Secondary | ICD-10-CM | POA: Diagnosis not present

## 2019-11-25 ENCOUNTER — Other Ambulatory Visit: Payer: Self-pay | Admitting: Family Medicine

## 2019-11-25 NOTE — Telephone Encounter (Signed)
LOV on 10-03-2019 annual physical with Dr. Rosanna Randy / Will refill per protocol. /

## 2019-12-30 NOTE — Progress Notes (Deleted)
       Patient: Roger Chang. Male    DOB: Jun 04, 1961   59 y.o.   MRN: [id number] Visit Date: 12/30/2019  Today's Provider: Megan Mans, MD   No chief complaint on file.  Subjective:     HPI   Patient last office visit 10/03/2019. Labs checked showing-Cholesterol still moderately elevated. Mild decrease in kidney function. advised to push fluids and avoid any anti-inflammatory drugs. advised to recheck labs after the first of the year to make sure it is stable. Advised to stop any vitamins also that might have calcium supplements . Calcium borderline high. We will followed up with that on the next visit too.  Allergies  Allergen Reactions  . Lisinopril Swelling    Swelling of the lips and dry mouth  . Amlodipine Swelling    Swelling in extremeties more than mild, pt was not comfortable with that  . Hydrochlorothiazide Swelling    Swelling of the foot, possibly contributed to developing gout, felt dehydrated, B/P not controlled  . Ambien [Zolpidem]     Very groggy and confused the morning after  . Losartan Other (See Comments)    Weird dreams, ED, swelling     Current Outpatient Medications:  .  aspirin 81 MG tablet, Take by mouth., Disp: , Rfl:  .  cholecalciferol (VITAMIN D) 1000 UNITS tablet, Take by mouth., Disp: , Rfl:  .  diltiazem (CARDIZEM CD) 180 MG 24 hr capsule, TAKE 1 CAPSULE BY MOUTH EVERY DAY, Disp: 30 capsule, Rfl: 12 .  eszopiclone (LUNESTA) 2 MG TABS tablet, TAKE 1 TABLET BY MOUTH EVERYDAY AT BEDTIME, Disp: 30 tablet, Rfl: 5 .  labetalol (NORMODYNE) 200 MG tablet, TAKE 1 TABLET BY MOUTH EVERY DAY, Disp: 30 tablet, Rfl: 11 .  pravastatin (PRAVACHOL) 40 MG tablet, TAKE 1 TABLET BY MOUTH EVERY DAY, Disp: 30 tablet, Rfl: 11 .  rOPINIRole (REQUIP) 2 MG tablet, TAKE 1/2-1 TABLET BY MOUTH AT BEDTIME, Disp: 30 tablet, Rfl: 5  Review of Systems  Constitutional: Negative for appetite change, chills and fever.  Respiratory: Negative for chest tightness,  shortness of breath and wheezing.   Cardiovascular: Negative for chest pain and palpitations.  Gastrointestinal: Negative for abdominal pain, nausea and vomiting.    Social History   Tobacco Use  . Smoking status: Never Smoker  . Smokeless tobacco: Never Used  Substance Use Topics  . Alcohol use: Yes    Alcohol/week: 6.0 standard drinks    Types: 6 Glasses of wine per week    Comment: occasionally      Objective:   There were no vitals taken for this visit. There were no vitals filed for this visit.There is no height or weight on file to calculate BMI.   Physical Exam   No results found for any visits on 01/02/20.     Assessment & Plan        Megan Mans, MD  [facility]

## 2020-01-02 ENCOUNTER — Ambulatory Visit: Payer: BC Managed Care – PPO | Admitting: Family Medicine

## 2020-02-13 ENCOUNTER — Other Ambulatory Visit: Payer: Self-pay | Admitting: Family Medicine

## 2020-02-13 DIAGNOSIS — E7800 Pure hypercholesterolemia, unspecified: Secondary | ICD-10-CM

## 2020-03-06 ENCOUNTER — Telehealth: Payer: Self-pay

## 2020-03-06 NOTE — Telephone Encounter (Signed)
Copied from CRM [id number]. Topic: General - Other >> Mar 06, 2020  1:34 PM Gwenlyn Fudge wrote: Reason for CRM: Pt is requesting to have his tetanus and Hep B injection done as well. Please advise.

## 2020-03-06 NOTE — Telephone Encounter (Signed)
Copied from CRM [id number]. Topic: Clinical - COVID Pre-Screen >> Mar 06, 2020  1:34 PM Gwenlyn Fudge wrote: 1. To the best of your knowledge, have you been in close contact with anyone with a confirmed diagnosis of COVID 19?  no  If no - Proceed to next question; If yes - Schedule patient for a virtual visit  2. Have you had any one or more of the following: fever, chills, cough, shortness of breath or any flu-like symptoms?  no  If no - Proceed to next question; If yes - Schedule patient for a virtual visit  3. Have you been diagnosed with or have a previous diagnosis of COVID 19?  no  If no - Proceed to next question; If yes - Schedule patient for a virtual visit  4. I am going to go over a few other symptoms with you. Please let me know if you are experiencing any of the following: no  Ear, nose or throat discomfort  A sore throat  Headache  Muscle pain  Diarrhea  Loss of taste or smell  If no - Continue with scheduling process; If yes - Document in scheduling notes   Thank you for answering these questions. Please know we will ask you these questions or similar questions when you arrive for your appointment and again it's how we are keeping everyone safe. Also, to keep you safe, please use the provided hand sanitizer when you enter the building. Virginia Crews., we are asking everyone in the building to wear a mask because they help [place] prevent the spread of germs.   Do you have a mask of your own, if not, we are happy to provide one for you. The last thing I want to go over with you is the no visitor guidelines. This means no one can attend the appointment with you unless you need physical assistance. I understand this may be different from your past appointments and I know this may be difficult but please know if someone is driving you we are happy to call them for you once your appointment is over.

## 2020-03-07 ENCOUNTER — Other Ambulatory Visit: Payer: Self-pay | Admitting: Family Medicine

## 2020-03-07 DIAGNOSIS — E7800 Pure hypercholesterolemia, unspecified: Secondary | ICD-10-CM

## 2020-03-07 NOTE — Progress Notes (Signed)
Patient: Roger Chang. Male    DOB: 05-15-1961   59 y.o.   MRN: [id number] Visit Date: 03/08/2020  Today's Provider: [name], MD   Chief Complaint  Patient presents with  . Follow-up  . Hyperlipidemia  . Hypertension   Subjective:     HPI  Pt doing well. Had first Covid vaccine yestrday. He wears his CPAP nightly. Kidney stones From 10/03/2019-Referred back to urology for history of kidney stones.  Hyperlipidemia   From 10/03/2019-labs checked showing-Cholesterol still moderately elevated. Mild decrease in kidney function. Push fluids and avoid any anti-inflammatory drugs. Let us see you back after the first of the year to recheck this to make sure it is stable. Advised to stop any vitamins also that might have calcium supplements . Calcium borderline high. We will followed up with that on the next visit too.  Essential Hypertension From 10/03/2019-no changes.    Allergies  Allergen Reactions  . Lisinopril Swelling    Swelling of the lips and dry mouth  . Amlodipine Swelling    Swelling in extremeties more than mild, pt was not comfortable with that  . Hydrochlorothiazide Swelling    Swelling of the foot, possibly contributed to developing gout, felt dehydrated, B/P not controlled  . Ambien [Zolpidem]     Very groggy and confused the morning after  . Losartan Other (See Comments)    Weird dreams, ED, swelling     Current Outpatient Medications:  .  cholecalciferol (VITAMIN D) 1000 UNITS tablet, Take by mouth., Disp: , Rfl:  .  diltiazem (CARDIZEM CD) 180 MG 24 hr capsule, TAKE 1 CAPSULE BY MOUTH EVERY DAY, Disp: 30 capsule, Rfl: 12 .  labetalol (NORMODYNE) 200 MG tablet, TAKE 1 TABLET BY MOUTH EVERY DAY, Disp: 30 tablet, Rfl: 11 .  pravastatin (PRAVACHOL) 40 MG tablet, TAKE 1 TABLET BY MOUTH EVERY DAY, Disp: 30 tablet, Rfl: 0 .  rOPINIRole (REQUIP) 2 MG tablet, TAKE 1/2-1 TABLET BY MOUTH AT BEDTIME, Disp: 30 tablet, Rfl: 5 .  aspirin 81 MG  tablet, Take by mouth., Disp: , Rfl:  .  eszopiclone (LUNESTA) 2 MG TABS tablet, TAKE 1 TABLET BY MOUTH EVERYDAY AT BEDTIME (Patient not taking: Reported on 03/08/2020), Disp: 30 tablet, Rfl: 5  Review of Systems  Constitutional: Negative for appetite change, chills and fever.  HENT: Negative.   Eyes: Negative.   Respiratory: Negative for chest tightness, shortness of breath and wheezing.   Cardiovascular: Negative for chest pain and palpitations.  Gastrointestinal: Negative for abdominal pain, nausea and vomiting.  Endocrine: Negative.   Allergic/Immunologic: Negative.   Neurological: Negative.   Hematological: Negative.   Psychiatric/Behavioral: Negative.     Social History   Tobacco Use  . Smoking status: Never Smoker  . Smokeless tobacco: Never Used  Substance Use Topics  . Alcohol use: Yes    Alcohol/week: 6.0 standard drinks    Types: 6 Glasses of wine per week    Comment: occasionally      Objective:   BP (!) 143/88 (BP Location: Right Arm, Patient Position: Sitting, Cuff Size: Large)   Pulse 71   Temp (!) 97.1 F (36.2 C) (Other (Comment))   Resp 16   Ht 5\' 9"  (1.753 m)   Wt 214 lb (97.1 kg)   SpO2 98%   BMI 31.60 kg/m  Vitals:   03/08/20 1024  BP: (!) 143/88  Pulse: 71  Resp: 16  Temp: (!) 97.1 F (36.2 C)  TempSrc: Other (Comment)  SpO2: 98%  Weight: 214 lb (97.1 kg)  Height: 5\' 9"  (1.753 m)  Body mass index is 31.6 kg/m.   Physical Exam Vitals reviewed.  Constitutional:      Appearance: He is well-developed.  HENT:     Head: Normocephalic and atraumatic.     Right Ear: External ear normal.     Left Ear: External ear normal.     Nose: Nose normal.  Eyes:     Conjunctiva/sclera: Conjunctivae normal.     Pupils: Pupils are equal, round, and reactive to light.  Cardiovascular:     Rate and Rhythm: Normal rate and regular rhythm.     Heart sounds: Normal heart sounds.  Pulmonary:     Effort: Pulmonary effort is normal.     Breath sounds:  Normal breath sounds.  Abdominal:     General: Bowel sounds are normal.     Palpations: Abdomen is soft.  Genitourinary:    Penis: Normal.      Testes: Normal.  Musculoskeletal:        General: Normal range of motion.     Cervical back: Normal range of motion and neck supple.  Skin:    General: Skin is warm and dry.  Neurological:     General: No focal deficit present.     Mental Status: He is alert and oriented to person, place, and time.  Psychiatric:        Mood and Affect: Mood normal.        Behavior: Behavior normal.        Thought Content: Thought content normal.        Judgment: Judgment normal.      No results found for any visits on 03/08/20.     Assessment & Plan    1. Essential hypertension  - Lipid panel - Comprehensive Metabolic Panel (CMET) - Calcium, ionized - Parathyroid hormone, intact (no Ca)  2. Hyperlipidemia, unspecified hyperlipidemia type  - Lipid panel - Comprehensive Metabolic Panel (CMET) - Calcium, ionized - Parathyroid hormone, intact (no Ca)  3. Chronic kidney disease, unspecified CKD stage  - Lipid panel - Comprehensive Metabolic Panel (CMET) - Calcium, ionized - Parathyroid hormone, intact (no Ca)  4. Insomnia, unspecified type  - Lipid panel - Comprehensive Metabolic Panel (CMET) - Calcium, ionized - Parathyroid hormone, intact (no Ca) - eszopiclone (LUNESTA) 2 MG TABS tablet; TAKE 1 TABLET BY MOUTH EVERYDAY AT BEDTIME  Dispense: 30 tablet; Refill: 5  5. Need for hepatitis C screening test  - Hepatitis C antibody  6. Encounter for screening for HIV  - HIV Antibody (routine testing w rflx)     03/10/20, MD  [facility]

## 2020-03-07 NOTE — Telephone Encounter (Signed)
Noted  

## 2020-03-07 NOTE — Telephone Encounter (Signed)
Requested Prescriptions  Pending Prescriptions Disp Refills  . pravastatin (PRAVACHOL) 40 MG tablet [Pharmacy Med Name: PRAVASTATIN SODIUM 40 MG TAB] 30 tablet 0    Sig: TAKE 1 TABLET BY MOUTH EVERY DAY     Cardiovascular:  Antilipid - Statins Failed - 03/07/2020  2:32 PM      Failed - Total Cholesterol in normal range and within 360 days    Cholesterol, Total  Date Value Ref Range Status  10/03/2019 257 (H) 100 - 199 mg/dL Final         Failed - LDL in normal range and within 360 days    LDL Chol Calc (NIH)  Date Value Ref Range Status  10/03/2019 165 (H) 0 - 99 mg/dL Final         Failed - Triglycerides in normal range and within 360 days    Triglycerides  Date Value Ref Range Status  10/03/2019 236 (H) 0 - 149 mg/dL Final         Failed - Valid encounter within last 12 months    Recent Outpatient Visits          5 months ago Annual physical exam   [facility] Maple Hudson., MD   2 years ago Annual physical exam   [facility] Maple Hudson., MD   2 years ago Hyperglycemia   [facility] Maple Hudson., MD   2 years ago Calf swelling   [facility] Maple Hudson., MD   3 years ago Annual physical exam   [facility] Maple Hudson., MD      Future Appointments            Tomorrow Maple Hudson., MD [facility]   In 7 months McGowan, Elana Alm [facility]           Passed - HDL in normal range and within 360 days    HDL  Date Value Ref Range Status  10/03/2019 48 >39 mg/dL Final         Passed - Patient is not pregnant

## 2020-03-08 ENCOUNTER — Other Ambulatory Visit: Payer: Self-pay | Admitting: Family Medicine

## 2020-03-08 ENCOUNTER — Other Ambulatory Visit: Payer: Self-pay

## 2020-03-08 ENCOUNTER — Encounter: Payer: Self-pay | Admitting: Family Medicine

## 2020-03-08 ENCOUNTER — Ambulatory Visit: Payer: BC Managed Care – PPO | Admitting: Family Medicine

## 2020-03-08 VITALS — BP 143/88 | HR 71 | Temp 97.1°F | Resp 16 | Ht 69.0 in | Wt 214.0 lb

## 2020-03-08 DIAGNOSIS — I10 Essential (primary) hypertension: Secondary | ICD-10-CM

## 2020-03-08 DIAGNOSIS — E785 Hyperlipidemia, unspecified: Secondary | ICD-10-CM | POA: Diagnosis not present

## 2020-03-08 DIAGNOSIS — Z1159 Encounter for screening for other viral diseases: Secondary | ICD-10-CM

## 2020-03-08 DIAGNOSIS — G4700 Insomnia, unspecified: Secondary | ICD-10-CM

## 2020-03-08 DIAGNOSIS — Z114 Encounter for screening for human immunodeficiency virus [HIV]: Secondary | ICD-10-CM

## 2020-03-08 DIAGNOSIS — N189 Chronic kidney disease, unspecified: Secondary | ICD-10-CM

## 2020-03-08 MED ORDER — ESZOPICLONE 2 MG PO TABS: ORAL_TABLET | ORAL | 5 refills | Status: DC

## 2020-03-09 LAB — COMPREHENSIVE METABOLIC PANEL
ALT: 34 IU/L (ref 0–44)
AST: 27 IU/L (ref 0–40)
Albumin/Globulin Ratio: 1.9 (ref 1.2–2.2)
Albumin: 4.6 g/dL (ref 3.8–4.9)
Alkaline Phosphatase: 43 IU/L (ref 39–117)
BUN/Creatinine Ratio: 8 — ABNORMAL LOW (ref 10–24)
BUN: 10 mg/dL (ref 8–27)
Bilirubin Total: 0.6 mg/dL (ref 0.0–1.2)
CO2: 24 mmol/L (ref 20–29)
Calcium: 9.9 mg/dL (ref 8.6–10.2)
Chloride: 101 mmol/L (ref 96–106)
Creatinine, Ser: 1.25 mg/dL (ref 0.76–1.27)
GFR calc Af Amer: 72 mL/min/{1.73_m2} (ref >59)
GFR calc non Af Amer: 62 mL/min/{1.73_m2} (ref >59)
Globulin, Total: 2.4 g/dL (ref 1.5–4.5)
Glucose: 97 mg/dL (ref 65–99)
Potassium: 4.3 mmol/L (ref 3.5–5.2)
Sodium: 142 mmol/L (ref 134–144)
Total Protein: 7 g/dL (ref 6.0–8.5)

## 2020-03-09 LAB — CALCIUM, IONIZED: Calcium, Ion: 5.2 mg/dL (ref 4.5–5.6)

## 2020-03-09 LAB — LIPID PANEL
Chol/HDL Ratio: 4.5 ratio (ref 0.0–5.0)
Cholesterol, Total: 200 mg/dL — ABNORMAL HIGH (ref 100–199)
HDL: 44 mg/dL (ref >39)
LDL Chol Calc (NIH): 120 mg/dL — ABNORMAL HIGH (ref 0–99)
Triglycerides: 207 mg/dL — ABNORMAL HIGH (ref 0–149)
VLDL Cholesterol Cal: 36 mg/dL (ref 5–40)

## 2020-03-09 LAB — HIV ANTIBODY (ROUTINE TESTING W REFLEX): HIV Screen 4th Generation wRfx: NONREACTIVE

## 2020-03-09 LAB — HEPATITIS C ANTIBODY: Hep C Virus Ab: <0.1 s/co ratio (ref 0.0–0.9)

## 2020-03-09 LAB — PARATHYROID HORMONE, INTACT (NO CA): PTH: 21 pg/mL (ref 15–65)

## 2020-03-12 ENCOUNTER — Telehealth: Payer: Self-pay

## 2020-03-12 NOTE — Telephone Encounter (Signed)
Patient advised.

## 2020-03-12 NOTE — Telephone Encounter (Signed)
-----   Message from Maple Hudson., MD sent at 03/12/2020  8:18 AM EDT ----- Labs all good.

## 2020-03-23 DIAGNOSIS — G4733 Obstructive sleep apnea (adult) (pediatric): Secondary | ICD-10-CM | POA: Diagnosis not present

## 2020-04-03 ENCOUNTER — Other Ambulatory Visit: Payer: Self-pay | Admitting: Family Medicine

## 2020-04-03 DIAGNOSIS — E7800 Pure hypercholesterolemia, unspecified: Secondary | ICD-10-CM

## 2020-05-02 ENCOUNTER — Other Ambulatory Visit: Payer: Self-pay | Admitting: Family Medicine

## 2020-05-02 DIAGNOSIS — E7800 Pure hypercholesterolemia, unspecified: Secondary | ICD-10-CM

## 2020-05-06 ENCOUNTER — Other Ambulatory Visit: Payer: Self-pay | Admitting: Family Medicine

## 2020-05-06 NOTE — Telephone Encounter (Signed)
Requested Prescriptions  Pending Prescriptions Disp Refills  . rOPINIRole (REQUIP) 2 MG tablet [Pharmacy Med Name: ROPINIROLE HCL 2 MG TABLET] 90 tablet 1    Sig: TAKE 1/2 TO 1 TABLET BY MOUTH AT BEDTIME     Neurology:  Parkinsonian Agents Failed - 05/06/2020  8:58 AM      Failed - Last BP in normal range    BP Readings from Last 1 Encounters:  03/08/20 (!) 143/88         Passed - Valid encounter within last 12 months    Recent Outpatient Visits          1 month ago Essential hypertension   [facility] Maple Hudson., MD   7 months ago Annual physical exam   [facility] Maple Hudson., MD   2 years ago Annual physical exam   [facility] Maple Hudson., MD   2 years ago Hyperglycemia   [facility] Maple Hudson., MD   3 years ago Calf swelling   [facility] Maple Hudson., MD      Future Appointments            In 5 months Maple Hudson., MD [facility]   In 5 months McGowan, Elana Alm [facility]

## 2020-08-28 ENCOUNTER — Encounter: Payer: Self-pay | Admitting: Family Medicine

## 2020-09-19 DIAGNOSIS — Z23 Encounter for immunization: Secondary | ICD-10-CM | POA: Diagnosis not present

## 2020-10-03 ENCOUNTER — Encounter: Payer: Self-pay | Admitting: Family Medicine

## 2020-10-04 DIAGNOSIS — Z03818 Encounter for observation for suspected exposure to other biological agents ruled out: Secondary | ICD-10-CM | POA: Diagnosis not present

## 2020-10-08 NOTE — Progress Notes (Signed)
Complete physical exam   Patient: Roger CrewsDavid W Cronce Jr.   DOB: 11/11/1961   59 y.o. Male  MRN: [id number] Visit Date: 10/09/2020  Today's healthcare provider: Megan Mansichard Lilyrose Tanney Jr, MD   Chief Complaint  Patient presents with  . Annual Exam   Subjective    Roger CrewsDavid W Dovel Jr. is a 59 y.o. male who presents today for a complete physical exam.  He reports consuming a general diet. Home exercise routine includes walking, biking,golf daily. He generally feels well. He reports sleeping well. He does have additional problems to discuss today.  He is a widowed father of 2 sons.  No grandchildren.  He works for [employer]. HPI  Patient reports that he had a gout flare up about 3 weeks ago. He is doing much better now.    Past Medical History:  Diagnosis Date  . Dyspnea   . Hypertension    Unspecified  . Microscopic hematuria 03/2016  . Sleep apnea    CPAP   Past Surgical History:  Procedure Laterality Date  . CHOLECYSTECTOMY    . COLONOSCOPY WITH PROPOFOL N/A 10/18/2019   Procedure: COLONOSCOPY WITH PROPOFOL;  Surgeon: Pasty Spillersahiliani, Varnita B, MD;  [facility];  Service: Endoscopy;  Laterality: N/A;  sleep apnea  . EXTRACORPOREAL SHOCK WAVE LITHOTRIPSY Left 04/10/2016   Procedure: EXTRACORPOREAL SHOCK WAVE LITHOTRIPSY (ESWL);  Surgeon: Hildred LaserBrian James Budzyn, MD;  Location: [facility];  Service: Urology;  Laterality: Left;  . HEMORRHOID SURGERY  2000  . LASIK    . VASECTOMY  1999   Social History   Socioeconomic History  . Marital status: Widowed    Spouse name: Not on file  . Number of children: Not on file  . Years of education: Not on file  . Highest education level: Not on file  Occupational History  . Occupation: Full time  Tobacco Use  . Smoking status: Never Smoker  . Smokeless tobacco: Never Used  Vaping Use  . Vaping Use: Never used  Substance and Sexual Activity  . Alcohol use: Yes    Alcohol/week: 6.0 standard drinks    Types: 6 Glasses of wine per week     Comment: occasionally  . Drug use: No  . Sexual activity: Not on file  Other Topics Concern  . Not on file  Social History Narrative   Pt gets regular exercise.   Social Determinants of Health   Financial Resource Strain:   . Difficulty of Paying Living Expenses: Not on file  Food Insecurity:   . Worried About Programme researcher, broadcasting/film/videounning Out of Food in the Last Year: Not on file  . Ran Out of Food in the Last Year: Not on file  Transportation Needs:   . Lack of Transportation (Medical): Not on file  . Lack of Transportation (Non-Medical): Not on file  Physical Activity:   . Days of Exercise per Week: Not on file  . Minutes of Exercise per Session: Not on file  Stress:   . Feeling of Stress : Not on file  Social Connections:   . Frequency of Communication with Friends and Family: Not on file  . Frequency of Social Gatherings with Friends and Family: Not on file  . Attends Religious Services: Not on file  . Active Member of Clubs or Organizations: Not on file  . Attends BankerClub or Organization Meetings: Not on file  . Marital Status: Not on file  Intimate Partner Violence:   . Fear of Current or Ex-Partner: Not on file  .  Emotionally Abused: Not on file  . Physically Abused: Not on file  . Sexually Abused: Not on file   Family Status  Relation Name Status  . Mother  Deceased at age 74  . Father  Deceased at age 4  . Sister  Deceased  . Brother  Alive  . Sister  Alive  . Sister  Alive  . Sister  Alive  . Brother  Alive  . Son  Alive  . Son  Alive  . Other  (Not Specified)  . Neg Hx  (Not Specified)   Family History  Problem Relation Age of Onset  . Breast cancer Mother   . Heart attack Father   . Lung cancer Father   . Cancer Other   . Kidney disease Neg Hx   . Prostate cancer Neg Hx   . Kidney cancer Neg Hx   . Bladder Cancer Neg Hx    Allergies  Allergen Reactions  . Lisinopril Swelling    Swelling of the lips and dry mouth  . Amlodipine Swelling    Swelling in extremeties  more than mild, pt was not comfortable with that  . Hydrochlorothiazide Swelling    Swelling of the foot, possibly contributed to developing gout, felt dehydrated, B/P not controlled  . Ambien [Zolpidem]     Very groggy and confused the morning after  . Losartan Other (See Comments)    Weird dreams, ED, swelling    Patient Care Team: Maple Hudson., MD as PCP - General (Family Medicine)   Medications: Outpatient Medications Prior to Visit  Medication Sig  . cholecalciferol (VITAMIN D) 1000 UNITS tablet Take by mouth.  . diltiazem (CARDIZEM CD) 180 MG 24 hr capsule TAKE 1 CAPSULE BY MOUTH EVERY DAY  . eszopiclone (LUNESTA) 2 MG TABS tablet TAKE 1 TABLET BY MOUTH EVERYDAY AT BEDTIME  . labetalol (NORMODYNE) 200 MG tablet TAKE 1 TABLET BY MOUTH EVERY DAY  . pravastatin (PRAVACHOL) 40 MG tablet TAKE 1 TABLET BY MOUTH EVERY DAY  . rOPINIRole (REQUIP) 2 MG tablet TAKE 1/2 TO 1 TABLET BY MOUTH AT BEDTIME  . aspirin 81 MG tablet Take by mouth.   No facility-administered medications prior to visit.    Review of Systems  Constitutional: Negative.   HENT: Negative.   Eyes: Negative.   Respiratory: Negative.   Cardiovascular: Negative.   Gastrointestinal: Negative.   Endocrine: Negative.   Genitourinary: Negative.   Musculoskeletal: Negative.   Skin: Negative.   Allergic/Immunologic: Negative.   Neurological: Negative.   Hematological: Negative.   Psychiatric/Behavioral: Negative.        Objective    BP 114/78 (BP Location: Right Arm, Patient Position: Sitting, Cuff Size: Large)   Pulse 68   Temp 98.5 F (36.9 C) (Oral)   Resp 16   Ht 5\' 8"  (1.727 m)   Wt 209 lb 12.8 oz (95.2 kg)   SpO2 100%   BMI 31.90 kg/m     Physical Exam Constitutional:      Appearance: Normal appearance. He is normal weight.  HENT:     Head: Normocephalic and atraumatic.     Right Ear: Tympanic membrane, ear canal and external ear normal.     Left Ear: Tympanic membrane, ear canal and  external ear normal.     Nose: Nose normal.     Mouth/Throat:     Mouth: Mucous membranes are moist.     Pharynx: Oropharynx is clear.  Eyes:     Extraocular Movements: Extraocular  movements intact.     Conjunctiva/sclera: Conjunctivae normal.     Pupils: Pupils are equal, round, and reactive to light.  Cardiovascular:     Rate and Rhythm: Normal rate and regular rhythm.     Pulses: Normal pulses.     Heart sounds: Normal heart sounds.  Pulmonary:     Effort: Pulmonary effort is normal.     Breath sounds: Normal breath sounds.  Abdominal:     General: Abdomen is flat. Bowel sounds are normal.     Palpations: Abdomen is soft.  Musculoskeletal:        General: Normal range of motion.     Cervical back: Normal range of motion and neck supple.  Skin:    General: Skin is warm and dry.  Neurological:     General: No focal deficit present.     Mental Status: He is alert and oriented to person, place, and time. Mental status is at baseline.  Psychiatric:        Mood and Affect: Mood normal.        Behavior: Behavior normal.        Thought Content: Thought content normal.        Judgment: Judgment normal.        Last depression screening scores PHQ 2/9 Scores 10/09/2020 10/03/2019 10/03/2019  PHQ - 2 Score 0 1 1  PHQ- 9 Score - 1 -   Last fall risk screening Fall Risk  10/09/2020  Falls in the past year? 0  Number falls in past yr: 0  Injury with Fall? 0  Risk for fall due to : No Fall Risks  Follow up Falls evaluation completed   Last Audit-C alcohol use screening Alcohol Use Disorder Test (AUDIT) 10/09/2020  1. How often do you have a drink containing alcohol? 3  2. How many drinks containing alcohol do you have on a typical day when you are drinking? 0  3. How often do you have six or more drinks on one occasion? 0  AUDIT-C Score 3  Alcohol Brief Interventions/Follow-up AUDIT Score <7 follow-up not indicated   A score of 3 or more in women, and 4 or more in men  indicates increased risk for alcohol abuse, EXCEPT if all of the points are from question 1   Results for orders placed or performed in visit on 10/09/20  POCT urinalysis dipstick  Result Value Ref Range   Color, UA Yellow    Clarity, UA Clear    Glucose, UA Negative Negative   Bilirubin, UA Negative    Ketones, UA Negative    Spec Grav, UA 1.020 1.010 - 1.025   Blood, UA Negative    pH, UA 6.0 5.0 - 8.0   Protein, UA Negative Negative   Urobilinogen, UA 0.2 0.2 or 1.0 E.U./dL   Nitrite, UA Negative    Leukocytes, UA Negative Negative   Appearance     Odor      Assessment & Plan    Routine Health Maintenance and Physical Exam  Exercise Activities and Dietary recommendations Goals   None     Immunization History  Administered Date(s) Administered  . Influenza,inj,Quad PF,6+ Mos 08/25/2017, 09/08/2018, 10/03/2019  . Influenza-Unspecified 01/29/2017  . Td 10/09/2020  . Tdap 08/13/2007    Health Maintenance  Topic Date Due  . COVID-19 Vaccine (1) Never done  . INFLUENZA VACCINE  07/22/2020  . COLONOSCOPY  10/17/2029  . TETANUS/TDAP  10/09/2030  . Hepatitis C Screening  Completed  .  HIV Screening  Completed    Discussed health benefits of physical activity, and encouraged him to engage in regular exercise appropriate for his age and condition.  1. Annual physical exam  - CBC with Differential/Platelet - Comprehensive metabolic panel - Hemoglobin A1c  2. Hyperlipidemia, unspecified hyperlipidemia type  - CBC with Differential/Platelet - Comprehensive metabolic panel - Lipid panel  3. Essential hypertension  - CBC with Differential/Platelet - Comprehensive metabolic panel - Hemoglobin A1c  4. Elevated hemoglobin A1c Diet and exercise stressed. - Hemoglobin A1c  5. Acute gout of left foot, unspecified cause  - Uric acid  6. Need for tetanus booster  - Td : Tetanus/diphtheria >7yo Preservative  free  7. Screening for prostate cancer  -  PSA  8. Kidney stones Per urology - POCT urinalysis dipstick   No follow-ups on file.        Ysabella Babiarz Wendelyn Breslow, MD  [facility] [phone] (phone) [phone] (fax)  [facility]

## 2020-10-09 ENCOUNTER — Other Ambulatory Visit: Payer: Self-pay

## 2020-10-09 ENCOUNTER — Encounter: Payer: Self-pay | Admitting: Family Medicine

## 2020-10-09 ENCOUNTER — Ambulatory Visit (INDEPENDENT_AMBULATORY_CARE_PROVIDER_SITE_OTHER): Payer: BC Managed Care – PPO | Admitting: Family Medicine

## 2020-10-09 ENCOUNTER — Other Ambulatory Visit: Payer: Self-pay | Admitting: Family Medicine

## 2020-10-09 VITALS — BP 114/78 | HR 68 | Temp 98.5°F | Resp 16 | Ht 68.0 in | Wt 209.8 lb

## 2020-10-09 DIAGNOSIS — Z23 Encounter for immunization: Secondary | ICD-10-CM | POA: Diagnosis not present

## 2020-10-09 DIAGNOSIS — Z125 Encounter for screening for malignant neoplasm of prostate: Secondary | ICD-10-CM

## 2020-10-09 DIAGNOSIS — Z0000 Encounter for general adult medical examination without abnormal findings: Secondary | ICD-10-CM

## 2020-10-09 DIAGNOSIS — R7309 Other abnormal glucose: Secondary | ICD-10-CM | POA: Diagnosis not present

## 2020-10-09 DIAGNOSIS — I10 Essential (primary) hypertension: Secondary | ICD-10-CM

## 2020-10-09 DIAGNOSIS — N200 Calculus of kidney: Secondary | ICD-10-CM | POA: Diagnosis not present

## 2020-10-09 DIAGNOSIS — M109 Gout, unspecified: Secondary | ICD-10-CM

## 2020-10-09 DIAGNOSIS — E785 Hyperlipidemia, unspecified: Secondary | ICD-10-CM | POA: Diagnosis not present

## 2020-10-09 LAB — POCT URINALYSIS DIPSTICK
Bilirubin, UA: NEGATIVE
Blood, UA: NEGATIVE
Glucose, UA: NEGATIVE
Ketones, UA: NEGATIVE
Leukocytes, UA: NEGATIVE
Nitrite, UA: NEGATIVE
Protein, UA: NEGATIVE
Spec Grav, UA: 1.02 (ref 1.010–1.025)
Urobilinogen, UA: 0.2 E.U./dL
pH, UA: 6 (ref 5.0–8.0)

## 2020-10-10 LAB — CBC WITH DIFFERENTIAL/PLATELET
Basophils Absolute: 0.1 10*3/uL (ref 0.0–0.2)
Basos: 2 %
EOS (ABSOLUTE): 0.1 10*3/uL (ref 0.0–0.4)
Eos: 3 %
Hematocrit: 50.4 % (ref 37.5–51.0)
Hemoglobin: 16.9 g/dL (ref 13.0–17.7)
Immature Grans (Abs): 0 10*3/uL (ref 0.0–0.1)
Immature Granulocytes: 1 %
Lymphocytes Absolute: 2.1 10*3/uL (ref 0.7–3.1)
Lymphs: 47 %
MCH: 30.5 pg (ref 26.6–33.0)
MCHC: 33.5 g/dL (ref 31.5–35.7)
MCV: 91 fL (ref 79–97)
Monocytes Absolute: 0.5 10*3/uL (ref 0.1–0.9)
Monocytes: 11 %
Neutrophils Absolute: 1.6 10*3/uL (ref 1.4–7.0)
Neutrophils: 36 %
Platelets: 274 10*3/uL (ref 150–450)
RBC: 5.55 x10E6/uL (ref 4.14–5.80)
RDW: 12.8 % (ref 11.6–15.4)
WBC: 4.4 10*3/uL (ref 3.4–10.8)

## 2020-10-10 LAB — COMPREHENSIVE METABOLIC PANEL
ALT: 25 IU/L (ref 0–44)
AST: 24 IU/L (ref 0–40)
Albumin/Globulin Ratio: 2 (ref 1.2–2.2)
Albumin: 4.9 g/dL — ABNORMAL HIGH (ref 3.8–4.8)
Alkaline Phosphatase: 39 IU/L — ABNORMAL LOW (ref 44–121)
BUN/Creatinine Ratio: 11 (ref 10–24)
BUN: 15 mg/dL (ref 8–27)
Bilirubin Total: 0.5 mg/dL (ref 0.0–1.2)
CO2: 23 mmol/L (ref 20–29)
Calcium: 10.3 mg/dL — ABNORMAL HIGH (ref 8.6–10.2)
Chloride: 103 mmol/L (ref 96–106)
Creatinine, Ser: 1.39 mg/dL — ABNORMAL HIGH (ref 0.76–1.27)
GFR calc Af Amer: 63 mL/min/{1.73_m2} (ref >59)
GFR calc non Af Amer: 54 mL/min/{1.73_m2} — ABNORMAL LOW (ref >59)
Globulin, Total: 2.4 g/dL (ref 1.5–4.5)
Glucose: 100 mg/dL — ABNORMAL HIGH (ref 65–99)
Potassium: 5 mmol/L (ref 3.5–5.2)
Sodium: 142 mmol/L (ref 134–144)
Total Protein: 7.3 g/dL (ref 6.0–8.5)

## 2020-10-10 LAB — LIPID PANEL
Chol/HDL Ratio: 5.1 ratio — ABNORMAL HIGH (ref 0.0–5.0)
Cholesterol, Total: 224 mg/dL — ABNORMAL HIGH (ref 100–199)
HDL: 44 mg/dL (ref >39)
LDL Chol Calc (NIH): 145 mg/dL — ABNORMAL HIGH (ref 0–99)
Triglycerides: 196 mg/dL — ABNORMAL HIGH (ref 0–149)
VLDL Cholesterol Cal: 35 mg/dL (ref 5–40)

## 2020-10-10 LAB — HEMOGLOBIN A1C
Est. average glucose Bld gHb Est-mCnc: 126 mg/dL
Hgb A1c MFr Bld: 6 % — ABNORMAL HIGH (ref 4.8–5.6)

## 2020-10-10 LAB — URIC ACID: Uric Acid: 9.6 mg/dL — ABNORMAL HIGH (ref 3.8–8.4)

## 2020-10-10 LAB — PSA: Prostate Specific Ag, Serum: 1.2 ng/mL (ref 0.0–4.0)

## 2020-10-11 NOTE — Progress Notes (Signed)
10/12/2020 10:20 AM   Roger Chang W Luberto Jr. 08/28/1961 [id number]  Referring provider: Maple HudsonGilbert, Richard L Jr., MD [address] [place] [id number]  Chief Complaint  Patient presents with  . Nephrolithiasis    HPI: Roger CrewsDavid W Attwood Jr. is a 59 y.o. male with nephrolithiasis, high risk hematuria, BPH with LU TS and ED who presents today for a yearly follow up.   Nephrolithiasis ESWL 2017 for left renal stone  KUB no stone appreciated UA negative for micro heme  High Risk Hematuria Non-smoker.  CT urogram 02/2016 There are 3 calculi within the left renal collecting system. The largest calculus measuring 5 mm is positioned within the renal pelvis with the patient supine, although drops into the UPJ with the patient prone. This may be intermittently obstructing.   Cysto 02/2016 with Dr. Sherryl BartersBudzyn NED.  No reports of gross hematuria.  UA today negative for micro heme.    BPH WITH LUTS  (prostate and/or bladder) IPSS score: 0/0  Previous score: 9/1     Major complaint(s):  None.  Denies any dysuria, hematuria or suprapubic pain.  Denies any recent fevers, chills, nausea or vomiting.  He does not have a family history of PCa.   IPSS    Row Name 10/12/20 0900         International Prostate Symptom Score   How often have you had the sensation of not emptying your bladder? Not at All     How often have you had to urinate less than every two hours? Not at All     How often have you found you stopped and started again several times when you urinated? Not at All     How often have you found it difficult to postpone urination? Not at All     How often have you had a weak urinary stream? Not at All     How often have you had to strain to start urination? Not at All     How many times did you typically get up at night to urinate? None     Total IPSS Score 0       Quality of Life due to urinary symptoms   If you were to spend the rest of your life with your urinary condition  just the way it is now how would you feel about that? Delighted            Score:  1-7 Mild 8-19 Moderate 20-35 Severe  Erectile dysfunction SHIM score: 23    Previous SHIM score: 20 Risk factors:  age, BPH, HTN, HLD and sleep apnea No painful erections or curvatures with his erections.    Still having/no longer having spontaneous erections   SHIM    Row Name 10/12/20 0955         SHIM: Over the last 6 months:   How do you rate your confidence that you could get and keep an erection? High     When you had erections with sexual stimulation, how often were your erections hard enough for penetration (entering your partner)? Most Times (much more than half the time)     During sexual intercourse, how often were you able to maintain your erection after you had penetrated (entered) your partner? Almost Always or Always     During sexual intercourse, how difficult was it to maintain your erection to completion of intercourse? Not Difficult     When you attempted sexual intercourse, how  often was it satisfactory for you? Almost Always or Always       SHIM Total Score   SHIM 23            Score: 1-7 Severe ED 8-11 Moderate ED 12-16 Mild-Moderate ED 17-21 Mild ED 22-25 No ED   PMH: Past Medical History:  Diagnosis Date  . Dyspnea   . Hypertension    Unspecified  . Microscopic hematuria 03/2016  . Sleep apnea    CPAP    Surgical History: Past Surgical History:  Procedure Laterality Date  . CHOLECYSTECTOMY    . COLONOSCOPY WITH PROPOFOL N/A 10/18/2019   Procedure: COLONOSCOPY WITH PROPOFOL;  Surgeon: Pasty Spillers, MD;  Location: [facility];  Service: Endoscopy;  Laterality: N/A;  sleep apnea  . EXTRACORPOREAL SHOCK WAVE LITHOTRIPSY Left 04/10/2016   Procedure: EXTRACORPOREAL SHOCK WAVE LITHOTRIPSY (ESWL);  Surgeon: Hildred Laser, MD;  Location: [facility];  Service: Urology;  Laterality: Left;  . HEMORRHOID SURGERY  2000  . LASIK    . VASECTOMY   1999    Home Medications:  Allergies as of 10/12/2020      Reactions   Lisinopril Swelling   Swelling of the lips and dry mouth   Amlodipine Swelling   Swelling in extremeties more than mild, pt was not comfortable with that   Hydrochlorothiazide Swelling   Swelling of the foot, possibly contributed to developing gout, felt dehydrated, B/P not controlled   Ambien [zolpidem]    Very groggy and confused the morning after   Losartan Other (See Comments)   Weird dreams, ED, swelling      Medication List       Accurate as of October 12, 2020 10:20 AM. If you have any questions, ask your nurse or doctor.        STOP taking these medications   aspirin 81 MG tablet Stopped by: Letia Guidry, PA-C   diltiazem 180 MG 24 hr capsule Commonly known as: CARDIZEM CD Stopped by: Aleksi Brummet, PA-C     TAKE these medications   cholecalciferol 1000 units tablet Commonly known as: VITAMIN D Take by mouth.   diltiazem 180 MG 24 hr capsule Commonly known as: TIAZAC Take by mouth.   eszopiclone 2 MG Tabs tablet Commonly known as: LUNESTA TAKE 1 TABLET BY MOUTH EVERYDAY AT BEDTIME   labetalol 200 MG tablet Commonly known as: NORMODYNE TAKE 1 TABLET BY MOUTH EVERY DAY   pravastatin 40 MG tablet Commonly known as: PRAVACHOL TAKE 1 TABLET BY MOUTH EVERY DAY   rOPINIRole 2 MG tablet Commonly known as: REQUIP TAKE 1/2 TO 1 TABLET BY MOUTH AT BEDTIME       Allergies:  Allergies  Allergen Reactions  . Lisinopril Swelling    Swelling of the lips and dry mouth  . Amlodipine Swelling    Swelling in extremeties more than mild, pt was not comfortable with that  . Hydrochlorothiazide Swelling    Swelling of the foot, possibly contributed to developing gout, felt dehydrated, B/P not controlled  . Ambien [Zolpidem]     Very groggy and confused the morning after  . Losartan Other (See Comments)    Weird dreams, ED, swelling    Family History: Family History  Problem  Relation Age of Onset  . Breast cancer Mother   . Heart attack Father   . Lung cancer Father   . Cancer Other   . Kidney disease Neg Hx   . Prostate cancer Neg Hx   .  Kidney cancer Neg Hx   . Bladder Cancer Neg Hx     Social History:  reports that he has never smoked. He has never used smokeless tobacco. He reports current alcohol use of about 6.0 standard drinks of alcohol per week. He reports that he does not use drugs.  ROS: Pertinent ROS in HPI  Physical Exam: BP 126/83   Pulse 65   Ht 5\' 8"  (1.727 m)   Wt 206 lb (93.4 kg)   BMI 31.32 kg/m   Constitutional:  Well nourished. Alert and oriented, No acute distress. HEENT: [place] AT, mask in place.  Trachea midline Cardiovascular: No clubbing, cyanosis, or edema. Respiratory: Normal respiratory effort, no increased work of breathing. GU: No CVA tenderness.  No bladder fullness or masses.  Patient with circumcised phallus.   Urethral meatus is patent.  No penile discharge. No penile lesions or rashes. Scrotum without lesions, cysts, rashes and/or edema.  Testicles are located scrotally bilaterally. No masses are appreciated in the testicles. Left and right epididymis are normal. Rectal: Patient with  normal sphincter tone. Anus and perineum without scarring or rashes. No rectal masses are appreciated. Prostate is approximately 60 + grams, could only palpate the apex and the midportion of the gland, no nodules are appreciated. Seminal vesicles could not be palpated Skin: No rashes, bruises or suspicious lesions. Lymph: No inguinal adenopathy. Neurologic: Grossly intact, no focal deficits, moving all 4 extremities. Psychiatric: Normal mood and affect.  Laboratory Data: Lab Results  Component Value Date   WBC 4.4 10/09/2020   HGB 16.9 10/09/2020   HCT 50.4 10/09/2020   MCV 91 10/09/2020   PLT 274 10/09/2020    Lab Results  Component Value Date   CREATININE 1.39 (H) 10/09/2020    Lab Results  Component Value Date   PSA 1.0  01/17/2015   Component     Latest Ref Rng & Units 02/20/2016 02/19/2017 03/02/2018 10/03/2019  Prostate Specific Ag, Serum     0.0 - 4.0 ng/mL 1.0 0.8 0.9 0.8   Component     Latest Ref Rng & Units 10/09/2020  Prostate Specific Ag, Serum     0.0 - 4.0 ng/mL 1.2   Lab Results  Component Value Date   HGBA1C 6.0 (H) 10/09/2020    Lab Results  Component Value Date   TSH 1.510 10/03/2019       Component Value Date/Time   CHOL 224 (H) 10/09/2020 1012   HDL 44 10/09/2020 1012   CHOLHDL 5.1 (H) 10/09/2020 1012   LDLCALC 145 (H) 10/09/2020 1012    Lab Results  Component Value Date   AST 24 10/09/2020   Lab Results  Component Value Date   ALT 25 10/09/2020    Urinalysis Component     Latest Ref Rng & Units 10/12/2020  Specific Gravity, UA     1.005 - 1.030 1.025  pH, UA     5.0 - 7.5 5.5  Color, UA     Yellow Yellow  Appearance Ur     Clear Clear  Leukocytes,UA     Negative Negative  Protein,UA     Negative/Trace Negative  Glucose, UA     Negative Negative  Ketones, UA     Negative Negative  RBC, UA     Negative Negative  Bilirubin, UA     Negative Negative  Urobilinogen, Ur     0.2 - 1.0 mg/dL 0.2  Nitrite, UA     Negative Negative  Microscopic Examination  See below:   Component     Latest Ref Rng & Units 10/12/2020  WBC, UA     0 - 5 /hpf 0-5  RBC     0 - 2 /hpf None seen  Epithelial Cells (non renal)     0 - 10 /hpf 0-10  Bacteria, UA     None seen/Few None seen  I have reviewed the labs.  Pertinent Imaging: CLINICAL DATA:  Kidney stones.  EXAM: ABDOMEN - 1 VIEW  COMPARISON:  Most recent radiograph 10/13/2019. CT 03/05/2016  FINDINGS: No visualized radiopaque calculi project over the renal beds or course of the ureters. Calcifications in the pelvis are typical of phleboliths and unchanged from prior exam. Normal bowel gas pattern with small to moderate volume of stool throughout the colon. No obstruction or evidence of free  air. The included lung bases are clear. No acute osseous abnormalities are seen.  IMPRESSION: No visualized radiopaque calculi.   Electronically Signed   By: Narda Rutherford M.D.   On: 10/13/2020 11:45 I have independently reviewed the films.  See HPI.   Assessment & Plan:    1. Nephrolithiasis No calculi seen on the last 2 KUBs We will continue to monitor clinically  2. High risk hematuria Work-up in March 2017 + for nephrolithiasis No reports of gross hematuria Today's UA negative for micro heme RTC in one year for UA recheck  3. BPH  IPSS score is 0/0 Continue conservative management, avoiding bladder irritants and timed voiding's Explained that an enlarging prostate can be a part of the aging process, but do not recommend treating enlarged prostate unless it is causing bothersome urinary symptoms as medication and surgical procedures carry risks and side effects such as ED and incontinence RTC in 12 months for IPSS, PSA and exam    Return in about 1 year (around 10/12/2021) for IPSS, SHIM, PSA and exam.  These notes generated with voice recognition software. I apologize for typographical errors.  Michiel Cowboy, PA-C  [facility] [address]  [address] [place], [place] [address] [phone]

## 2020-10-12 ENCOUNTER — Ambulatory Visit: Payer: BC Managed Care – PPO | Admitting: Urology

## 2020-10-12 ENCOUNTER — Ambulatory Visit
Admission: RE | Admit: 2020-10-12 | Discharge: 2020-10-12 | Disposition: A | Discharge status: Home / Self Care | Payer: BC Managed Care – PPO | Attending: Urology | Admitting: Urology

## 2020-10-12 ENCOUNTER — Other Ambulatory Visit: Payer: Self-pay

## 2020-10-12 ENCOUNTER — Other Ambulatory Visit: Payer: Self-pay | Admitting: Radiology

## 2020-10-12 ENCOUNTER — Ambulatory Visit
Admission: RE | Admit: 2020-10-12 | Discharge: 2020-10-12 | Disposition: A | Discharge status: Home / Self Care | Payer: BC Managed Care – PPO | Source: Ambulatory Visit | Attending: Urology | Admitting: Urology

## 2020-10-12 ENCOUNTER — Encounter: Payer: Self-pay | Admitting: Urology

## 2020-10-12 VITALS — BP 126/83 | HR 65 | Ht 68.0 in | Wt 206.0 lb

## 2020-10-12 DIAGNOSIS — N529 Male erectile dysfunction, unspecified: Secondary | ICD-10-CM

## 2020-10-12 DIAGNOSIS — R319 Hematuria, unspecified: Secondary | ICD-10-CM

## 2020-10-12 DIAGNOSIS — N200 Calculus of kidney: Secondary | ICD-10-CM

## 2020-10-12 DIAGNOSIS — N400 Enlarged prostate without lower urinary tract symptoms: Secondary | ICD-10-CM

## 2020-10-12 DIAGNOSIS — Z87442 Personal history of urinary calculi: Secondary | ICD-10-CM | POA: Diagnosis not present

## 2020-10-12 DIAGNOSIS — I878 Other specified disorders of veins: Secondary | ICD-10-CM | POA: Diagnosis not present

## 2020-10-12 DIAGNOSIS — N138 Other obstructive and reflux uropathy: Secondary | ICD-10-CM

## 2020-10-13 LAB — URINALYSIS, COMPLETE
Bilirubin, UA: NEGATIVE
Glucose, UA: NEGATIVE
Ketones, UA: NEGATIVE
Leukocytes,UA: NEGATIVE
Nitrite, UA: NEGATIVE
Protein,UA: NEGATIVE
RBC, UA: NEGATIVE
Specific Gravity, UA: 1.025 (ref 1.005–1.030)
Urobilinogen, Ur: 0.2 mg/dL (ref 0.2–1.0)
pH, UA: 5.5 (ref 5.0–7.5)

## 2020-10-13 LAB — MICROSCOPIC EXAMINATION
Bacteria, UA: NONE SEEN
RBC, Urine: NONE SEEN /hpf (ref 0–2)

## 2020-10-27 ENCOUNTER — Other Ambulatory Visit: Payer: Self-pay | Admitting: Family Medicine

## 2020-10-27 NOTE — Telephone Encounter (Signed)
Requested Prescriptions  Pending Prescriptions Disp Refills  . labetalol (NORMODYNE) 200 MG tablet [Pharmacy Med Name: LABETALOL HCL 200 MG TABLET] 90 tablet 1    Sig: TAKE 1 TABLET BY MOUTH EVERY DAY     Cardiovascular:  Beta Blockers Passed - 10/27/2020  9:41 PM      Passed - Last BP in normal range    BP Readings from Last 1 Encounters:  10/12/20 126/83         Passed - Last Heart Rate in normal range    Pulse Readings from Last 1 Encounters:  10/12/20 65         Passed - Valid encounter within last 6 months    Recent Outpatient Visits          2 weeks ago Annual physical exam   [facility] Maple Hudson., MD   7 months ago Essential hypertension   [facility] Maple Hudson., MD   1 year ago Annual physical exam   [facility] Maple Hudson., MD   2 years ago Annual physical exam   [facility] Maple Hudson., MD   3 years ago Hyperglycemia   [facility] Maple Hudson., MD      Future Appointments            In 5 months Maple Hudson., MD [facility]   In 11 months [place], Elana Alm  [facility]   In 11 months Maple Hudson., MD [facility]

## 2020-11-27 ENCOUNTER — Other Ambulatory Visit: Payer: Self-pay | Admitting: Family Medicine

## 2020-11-27 NOTE — Telephone Encounter (Signed)
   Notes to clinic Last ordered by a historical provider, however, Dr. Sullivan Lone previously ordered.

## 2020-12-25 ENCOUNTER — Other Ambulatory Visit: Payer: Self-pay

## 2020-12-25 DIAGNOSIS — G2581 Restless legs syndrome: Secondary | ICD-10-CM

## 2020-12-25 DIAGNOSIS — I10 Essential (primary) hypertension: Secondary | ICD-10-CM

## 2020-12-25 DIAGNOSIS — E7800 Pure hypercholesterolemia, unspecified: Secondary | ICD-10-CM

## 2020-12-25 DIAGNOSIS — G4700 Insomnia, unspecified: Secondary | ICD-10-CM

## 2020-12-25 NOTE — Telephone Encounter (Signed)
Copied from CRM [id number]. Topic: Quick Communication - See Telephone Encounter >> Dec 25, 2020  3:30 PM Aretta Nip wrote:  CRM for notification. See Telephone encounter for: 12/25/20. Pt is requesting all current meds except Vitamin D to be sent in original script form to his home address [address] in [place]  [address]. He states that his pharmacy is [facility] an offshoot of [facility] and he has been ask to have all his script written in paper form and mailed to him and he forwards to pharmacy... questions [phone] last appt in Oct 2021

## 2020-12-26 NOTE — Telephone Encounter (Signed)
Patient is wanting medication refills printed and mailed to his home. Ok to do? Please advise. Thanks!

## 2020-12-26 NOTE — Telephone Encounter (Signed)
Okay with me.  Thank you °

## 2021-01-02 NOTE — Telephone Encounter (Signed)
If you will print them I will sign them.  Thank you so much.

## 2021-01-03 ENCOUNTER — Other Ambulatory Visit: Payer: Self-pay

## 2021-01-03 DIAGNOSIS — G4700 Insomnia, unspecified: Secondary | ICD-10-CM

## 2021-01-03 DIAGNOSIS — G2581 Restless legs syndrome: Secondary | ICD-10-CM

## 2021-01-03 DIAGNOSIS — E7800 Pure hypercholesterolemia, unspecified: Secondary | ICD-10-CM

## 2021-01-03 DIAGNOSIS — I10 Essential (primary) hypertension: Secondary | ICD-10-CM

## 2021-01-03 MED ORDER — LABETALOL HCL 200 MG PO TABS: 200.0000 mg | ORAL_TABLET | Freq: Every day | ORAL | 3 refills | Status: DC

## 2021-01-03 MED ORDER — PRAVASTATIN SODIUM 40 MG PO TABS: 40.0000 mg | ORAL_TABLET | Freq: Every day | ORAL | 3 refills | Status: DC

## 2021-01-03 MED ORDER — ESZOPICLONE 2 MG PO TABS: ORAL_TABLET | ORAL | 3 refills | Status: DC

## 2021-01-03 MED ORDER — ROPINIROLE HCL 2 MG PO TABS: ORAL_TABLET | ORAL | 2 refills | Status: DC

## 2021-01-03 MED ORDER — DILTIAZEM HCL ER COATED BEADS 180 MG PO CP24: ORAL_CAPSULE | ORAL | 3 refills | Status: DC

## 2021-01-30 ENCOUNTER — Ambulatory Visit: Payer: Self-pay | Admitting: Family Medicine

## 2021-01-30 NOTE — Telephone Encounter (Signed)
Pt reports right lower back/flank pain, onset Monday. Constant 8/10. States has H/O kidney stones "And this is the same type of pain." Denies any injury, lifting. Denies hematuria, reports frequent urination. States "I've just been on the couch since Monday with a heating pad." Pt sounds distressed. Advised ED; declines. Asking to speak with Dr. Sullivan Lone. Again reiterated need for ED eval, declines. Assured pt NT would route to practice for PCPs review.   Please review and advise: [phone] Reason for Disposition . [1] Pain or burning with passing urine (urination) AND [2] flank pain (i.e., in side, below ribs and above hip)  Answer Assessment - Initial Assessment Questions 1. ONSET: "When did the pain begin?"      Monday 2. LOCATION: "Where does it hurt?" (upper, mid or lower back)    Lower right  back 3. SEVERITY: "How bad is the pain?"  (e.g., Scale 1-10; mild, moderate, or severe)   - MILD (1-3): doesn't interfere with normal activities    - MODERATE (4-7): interferes with normal activities or awakens from sleep    - SEVERE (8-10): excruciating pain, unable to do any normal activities      8/10 4. PATTERN: "Is the pain constant?" (e.g., yes, no; constant, intermittent)      Constant 5. RADIATION: "Does the pain shoot into your legs or elsewhere?"     No 6. CAUSE:  "What do you think is causing the back pain?"      Kidney stones 7. BACK OVERUSE:  "Any recent lifting of heavy objects, strenuous work or exercise?"     no 8. MEDICATIONS: "What have you taken so far for the pain?" (e.g., nothing, acetaminophen, NSAIDS)    Heating pad 9. NEUROLOGIC SYMPTOMS: "Do you have any weakness, numbness, or problems with bowel/bladder control?"      10. OTHER SYMPTOMS: "Do you have any other symptoms?" (e.g., fever, abdominal pain, burning with urination, blood in urine)   Frequent urination  Protocols used: BACK PAIN-A-AH

## 2021-01-31 ENCOUNTER — Ambulatory Visit
Admission: RE | Admit: 2021-01-31 | Discharge: 2021-01-31 | Disposition: A | Discharge status: Home / Self Care | Payer: BC Managed Care – PPO | Source: Ambulatory Visit | Attending: Urology | Admitting: Urology

## 2021-01-31 ENCOUNTER — Encounter: Payer: Self-pay | Admitting: Urology

## 2021-01-31 ENCOUNTER — Other Ambulatory Visit: Payer: Self-pay | Admitting: Urology

## 2021-01-31 ENCOUNTER — Ambulatory Visit: Payer: BC Managed Care – PPO | Admitting: Urology

## 2021-01-31 ENCOUNTER — Other Ambulatory Visit
Admission: RE | Admit: 2021-01-31 | Discharge: 2021-01-31 | Disposition: A | Discharge status: Home / Self Care | Payer: BC Managed Care – PPO | Source: Ambulatory Visit | Attending: Urology | Admitting: Urology

## 2021-01-31 ENCOUNTER — Other Ambulatory Visit: Payer: Self-pay

## 2021-01-31 VITALS — BP 138/83 | HR 75 | Ht 68.0 in | Wt 200.0 lb

## 2021-01-31 DIAGNOSIS — N401 Enlarged prostate with lower urinary tract symptoms: Secondary | ICD-10-CM | POA: Diagnosis not present

## 2021-01-31 DIAGNOSIS — Z9049 Acquired absence of other specified parts of digestive tract: Secondary | ICD-10-CM | POA: Diagnosis not present

## 2021-01-31 DIAGNOSIS — N200 Calculus of kidney: Secondary | ICD-10-CM

## 2021-01-31 DIAGNOSIS — N201 Calculus of ureter: Secondary | ICD-10-CM | POA: Diagnosis not present

## 2021-01-31 DIAGNOSIS — Z01812 Encounter for preprocedural laboratory examination: Secondary | ICD-10-CM | POA: Insufficient documentation

## 2021-01-31 DIAGNOSIS — K5730 Diverticulosis of large intestine without perforation or abscess without bleeding: Secondary | ICD-10-CM | POA: Diagnosis not present

## 2021-01-31 DIAGNOSIS — N3090 Cystitis, unspecified without hematuria: Secondary | ICD-10-CM | POA: Diagnosis not present

## 2021-01-31 DIAGNOSIS — R109 Unspecified abdominal pain: Secondary | ICD-10-CM | POA: Diagnosis not present

## 2021-01-31 DIAGNOSIS — Z20822 Contact with and (suspected) exposure to covid-19: Secondary | ICD-10-CM | POA: Diagnosis not present

## 2021-01-31 DIAGNOSIS — Z888 Allergy status to other drugs, medicaments and biological substances status: Secondary | ICD-10-CM | POA: Diagnosis not present

## 2021-01-31 DIAGNOSIS — N1330 Unspecified hydronephrosis: Secondary | ICD-10-CM | POA: Diagnosis not present

## 2021-01-31 DIAGNOSIS — R350 Frequency of micturition: Secondary | ICD-10-CM | POA: Diagnosis not present

## 2021-01-31 DIAGNOSIS — N132 Hydronephrosis with renal and ureteral calculous obstruction: Secondary | ICD-10-CM | POA: Diagnosis not present

## 2021-01-31 DIAGNOSIS — N202 Calculus of kidney with calculus of ureter: Secondary | ICD-10-CM | POA: Diagnosis not present

## 2021-01-31 DIAGNOSIS — Z79899 Other long term (current) drug therapy: Secondary | ICD-10-CM | POA: Diagnosis not present

## 2021-01-31 DIAGNOSIS — I878 Other specified disorders of veins: Secondary | ICD-10-CM | POA: Diagnosis not present

## 2021-01-31 DIAGNOSIS — I861 Scrotal varices: Secondary | ICD-10-CM | POA: Diagnosis not present

## 2021-01-31 DIAGNOSIS — N329 Bladder disorder, unspecified: Secondary | ICD-10-CM | POA: Diagnosis not present

## 2021-01-31 DIAGNOSIS — M47816 Spondylosis without myelopathy or radiculopathy, lumbar region: Secondary | ICD-10-CM | POA: Diagnosis not present

## 2021-01-31 LAB — URINALYSIS, COMPLETE
Bilirubin, UA: NEGATIVE
Glucose, UA: NEGATIVE
Nitrite, UA: NEGATIVE
Specific Gravity, UA: 1.02 (ref 1.005–1.030)
Urobilinogen, Ur: 0.2 mg/dL (ref 0.2–1.0)
pH, UA: 5.5 (ref 5.0–7.5)

## 2021-01-31 LAB — MICROSCOPIC EXAMINATION: RBC, Urine: >30 /hpf — AB (ref 0–2)

## 2021-01-31 MED ORDER — ONDANSETRON HCL 4 MG PO TABS: 4.0000 mg | ORAL_TABLET | Freq: Three times a day (TID) | ORAL | 0 refills | Status: DC | PRN

## 2021-01-31 MED ORDER — HYDROCODONE-ACETAMINOPHEN 5-325 MG PO TABS
1.0000 | ORAL_TABLET | Freq: Four times a day (QID) | ORAL | 0 refills | Status: DC | PRN
Start: 2021-01-31 — End: 2022-03-27

## 2021-01-31 MED ORDER — TAMSULOSIN HCL 0.4 MG PO CAPS: 0.4000 mg | ORAL_CAPSULE | Freq: Every day | ORAL | 3 refills | Status: DC

## 2021-01-31 MED ORDER — SULFAMETHOXAZOLE-TRIMETHOPRIM 800-160 MG PO TABS: 1.0000 | ORAL_TABLET | Freq: Two times a day (BID) | ORAL | 0 refills | Status: DC

## 2021-01-31 NOTE — H&P (View-Only) (Signed)
01/31/2021 1:23 PM   Roger Chang. 1961/07/23 [id number]  Referring provider: Jerrol Banana., MD [address] [place],  [place] [address]  Chief Complaint  Patient presents with  . Follow-up    Lower right flank pain   Urological history: 1. Nephrolithiasis - ESWL 2017 for left renal stone   2. High risk hematuria - non-smoker - CTU 2017 3 calculi within the left renal collecting system. The largest calculus measuring 5 mm is positioned within the renal pelvis with the patient supine, although drops into the UPJ with the patient prone. This may be intermittently obstructing - cysto 2017 NED - UA > 30 RBC's - likely due to stones  3. BPH with LU TS - PSA 1.2 in 09/2020 - I PSS 0/0  4. ED - SHIM 23 - contributing factors of age, BPH, HTN, HLD and sleep apnea   HPI: Roger Chang. is a 60 y.o. male who presents today for an urgent visit for right sided flank pain.   He has been experiencing 2 weeks of right-sided pain associated with nausea and vomiting.  He is also been experiencing urinary frequency.   Patient denies any modifying or aggravating factors.  Patient denies any gross hematuria, dysuria or suprapubic.  Patient denies any fevers or chills.  UA > 30 RBC's and moderate bacteria.    PMH: Past Medical History:  Diagnosis Date  . Dyspnea   . Hypertension    Unspecified  . Microscopic hematuria 03/2016  . Sleep apnea    CPAP    Surgical History: Past Surgical History:  Procedure Laterality Date  . CHOLECYSTECTOMY    . COLONOSCOPY WITH PROPOFOL N/A 10/18/2019   Procedure: COLONOSCOPY WITH PROPOFOL;  Surgeon: Virgel Manifold, MD;  Location: [facility];  Service: Endoscopy;  Laterality: N/A;  sleep apnea  . EXTRACORPOREAL SHOCK WAVE LITHOTRIPSY Left 04/10/2016   Procedure: EXTRACORPOREAL SHOCK WAVE LITHOTRIPSY (ESWL);  Surgeon: Nickie Retort, MD;  Location: [facility];  Service: Urology;  Laterality: Left;  .  HEMORRHOID SURGERY  2000  . LASIK    . VASECTOMY  1999    Home Medications:  Allergies as of 01/31/2021      Reactions   Lisinopril Swelling   Swelling of the lips and dry mouth   Amlodipine Swelling   Swelling in extremeties more than mild, pt was not comfortable with that   Hydrochlorothiazide Swelling   Swelling of the foot, possibly contributed to developing gout, felt dehydrated, B/P not controlled   Ambien [zolpidem]    Very groggy and confused the morning after   Losartan Other (See Comments)   Weird dreams, ED, swelling      Medication List       Accurate as of January 31, 2021  1:23 PM. If you have any questions, ask your nurse or doctor.        cholecalciferol 1000 units tablet Commonly known as: VITAMIN D Take by mouth.   diltiazem 180 MG 24 hr capsule Commonly known as: TIAZAC Take by mouth.   diltiazem 180 MG 24 hr capsule Commonly known as: CARDIZEM CD TAKE 1 CAPSULE BY MOUTH EVERY DAY   eszopiclone 2 MG Tabs tablet Commonly known as: LUNESTA TAKE 1 TABLET BY MOUTH EVERYDAY AT BEDTIME   HYDROcodone-acetaminophen 5-325 MG tablet Commonly known as: NORCO/VICODIN Take 1 tablet by mouth every 6 (six) hours as needed for moderate pain. Started by: Zara Council, PA-C   labetalol 200  MG tablet Commonly known as: NORMODYNE Take 1 tablet (200 mg total) by mouth daily.   ondansetron 4 MG tablet Commonly known as: Zofran Take 1 tablet (4 mg total) by mouth every 8 (eight) hours as needed for nausea or vomiting. Started by: Zara Council, PA-C   pravastatin 40 MG tablet Commonly known as: PRAVACHOL Take 1 tablet (40 mg total) by mouth daily.   rOPINIRole 2 MG tablet Commonly known as: REQUIP TAKE 1/2 TO 1 TABLET BY MOUTH AT BEDTIME   sulfamethoxazole-trimethoprim 800-160 MG tablet Commonly known as: BACTRIM DS Take 1 tablet by mouth every 12 (twelve) hours. Started by: Zara Council, PA-C   tamsulosin 0.4 MG Caps capsule Commonly known  as: FLOMAX Take 1 capsule (0.4 mg total) by mouth daily. Started by: Zara Council, PA-C       Allergies:  Allergies  Allergen Reactions  . Lisinopril Swelling    Swelling of the lips and dry mouth  . Amlodipine Swelling    Swelling in extremeties more than mild, pt was not comfortable with that  . Hydrochlorothiazide Swelling    Swelling of the foot, possibly contributed to developing gout, felt dehydrated, B/P not controlled  . Ambien [Zolpidem]     Very groggy and confused the morning after  . Losartan Other (See Comments)    Weird dreams, ED, swelling    Family History: Family History  Problem Relation Age of Onset  . Breast cancer Mother   . Heart attack Father   . Lung cancer Father   . Cancer Other   . Kidney disease Neg Hx   . Prostate cancer Neg Hx   . Kidney cancer Neg Hx   . Bladder Cancer Neg Hx     Social History:  reports that he has never smoked. He has never used smokeless tobacco. He reports current alcohol use of about 6.0 standard drinks of alcohol per week. He reports that he does not use drugs.  ROS: Pertinent ROS in HPI  Physical Exam: BP 138/83   Pulse 75   Ht 5' 8" (1.727 m)   Wt 200 lb (90.7 kg)   BMI 30.41 kg/m   Constitutional:  Well nourished. Alert and oriented, No acute distress. HEENT: [place] AT, mask in place.  Trachea midline Cardiovascular: No clubbing, cyanosis, or edema. Respiratory: Normal respiratory effort, no increased work of breathing. Neurologic: Grossly intact, no focal deficits, moving all 4 extremities. Psychiatric: Normal mood and affect.  Laboratory Data: Lab Results  Component Value Date   WBC 4.4 10/09/2020   HGB 16.9 10/09/2020   HCT 50.4 10/09/2020   MCV 91 10/09/2020   PLT 274 10/09/2020    Lab Results  Component Value Date   CREATININE 1.39 (H) 10/09/2020    Lab Results  Component Value Date   PSA 1.0 01/17/2015   Component     Latest Ref Rng & Units 02/20/2016 02/19/2017 03/02/2018 10/03/2019   Prostate Specific Ag, Serum     0.0 - 4.0 ng/mL 1.0 0.8 0.9 0.8   Component     Latest Ref Rng & Units 10/09/2020  Prostate Specific Ag, Serum     0.0 - 4.0 ng/mL 1.2   Lab Results  Component Value Date   HGBA1C 6.0 (H) 10/09/2020    Lab Results  Component Value Date   TSH 1.510 10/03/2019       Component Value Date/Time   CHOL 224 (H) 10/09/2020 1012   HDL 44 10/09/2020 1012   CHOLHDL 5.1 (H) 10/09/2020  [place] 145 (H) 10/09/2020 1012    Lab Results  Component Value Date   AST 24 10/09/2020   Lab Results  Component Value Date   ALT 25 10/09/2020    Urinalysis Component     Latest Ref Rng & Units 01/31/2021  Specific Gravity, UA     1.005 - 1.030 1.020  pH, UA     5.0 - 7.5 5.5  Color, UA     Yellow Yellow  Appearance Ur     Clear Clear  Leukocytes,UA     Negative Trace (A)  Protein,UA     Negative/Trace 1+ (A)  Glucose, UA     Negative Negative  Ketones, UA     Negative Trace (A)  RBC, UA     Negative 3+ (A)  Bilirubin, UA     Negative Negative  Urobilinogen, Ur     0.2 - 1.0 mg/dL 0.2  Nitrite, UA     Negative Negative  Microscopic Examination      See below:   Component     Latest Ref Rng & Units 01/31/2021  WBC, UA     0 - 5 /hpf 0-5  RBC     0 - 2 /hpf >30 (A)  Epithelial Cells (non renal)     0 - 10 /hpf 0-10  Casts     None seen /lpf Present (A)  Cast Type     N/A Granular casts (A)  Mucus, UA     Not Estab. Present (A)  Bacteria, UA     None seen/Few Many (A)  I have reviewed the labs.  Pertinent Imaging: Narrative & Impression  CLINICAL DATA:  Right flank pain, nephrolithiasis  EXAM: CT ABDOMEN AND PELVIS WITHOUT CONTRAST  TECHNIQUE: Multidetector CT imaging of the abdomen and pelvis was performed following the standard protocol without IV contrast.  COMPARISON:  03/05/2016  FINDINGS: Lower chest: The visualized lung bases are clear bilaterally. The visualized heart and pericardium are  unremarkable.  Hepatobiliary: No focal liver abnormality is seen. Status post cholecystectomy. No biliary dilatation.  Pancreas: Unremarkable  Spleen: Unremarkable  Adrenals/Urinary Tract: The adrenal glands are unremarkable. The kidneys are normal in size and position. There is mild right hydronephrosis secondary to an obstructing 2 mm x 4 mm x 4 mm calculus within the proximal right ureter at the level of the transverse process of L4. There is superimposed bilateral minimal nonobstructing nephrolithiasis with at least 3 left-sided and 2 right-sided nonobstructing calculi measuring up to 2 mm. Previously noted dominant 5 mm nonobstructing calculus within the left kidney is no longer visualized and has presumably passed in the interval. There is no hydronephrosis on the left. The bladder is decompressed and is unremarkable.  Stomach/Bowel: Mild sigmoid diverticulosis. The stomach, small bowel, and large bowel are otherwise unremarkable. No evidence of obstruction or focal inflammation. No free intraperitoneal gas or fluid. The appendix is not clearly identified, however, there are no secondary signs of appendicitis within the right lower quadrant.  Vascular/Lymphatic: Mild atherosclerotic calcification within the abdominal aorta. No aortic aneurysm identified. No pathologic adenopathy within the abdomen and pelvis.  Reproductive: Prostate is unremarkable. Small bilateral varicoceles are incidentally noted within the scrotum.  Other: Broad-based tiny umbilical hernia contains a single loop of unremarkable small bowel.  Musculoskeletal: No acute bone abnormality. Degenerative changes are seen within the lumbar spine.  IMPRESSION: Obstructing 2 mm x 4 mm x 4 mm calculus within the proximal right ureter resulting in mild right  hydronephrosis. Superimposed minimal bilateral nonobstructing nephrolithiasis.  Mild sigmoid diverticulosis.  Small bilateral  varicoceles  Aortic Atherosclerosis (ICD10-I70.0).   Electronically Signed   By: Fidela Salisbury MD   On: 01/31/2021 12:42    CLINICAL DATA:  Nephrolithiasis  EXAM: ABDOMEN - 1 VIEW  COMPARISON:  None. Findings are correlated with concurrently performed CT examination of the abdomen and pelvis.  FINDINGS: 3 mm calculus overlies the lower pole of the right kidney. Known right proximal ureteral calculus is not well visualized on this examination. The majority of tiny nonobstructing calculi noted bilaterally on recent CT examination are not identified on this exam. Normal abdominal gas pattern. Multiple phleboliths within the pelvis. Degenerative changes seen within the lumbar spine.  IMPRESSION: Minimal right nephrolithiasis. The majority of nonobstructing calculi and the obstructing right ureteral calculus seen on recent CT examination are not visualized on this exam.   Electronically Signed   By: Fidela Salisbury MD   On: 01/31/2021 12:44 I have independently reviewed the films.  See HPI.   Assessment & Plan:    1. Right ureteral/renal stones: CT Renal stone study obstructing 2 mm x 4 mm x 4 mm calculus within the proximal right ureter resulting in mild right hydronephrosis. Superimposed minimal bilateral nonobstructing nephrolithiasis. Explained to the patient that since their stone is  ?10 mm, it is in within AUA Guidelines to continue MET with tamsulosin and pushing fluids for 4 to 6 weeks Patient is not wanting to pursue MET therapy at this time, I explained that URS would be the best therapy as URS has a greater stone-free rate in a single procedure and the stone is not visible on KUB Schedule right ureteroscopy with laser lithotripsy and ureteral stent placement explained to the patient how the procedure is performed and the risks involved informed patient that they will have a stent placed during the procedure and will remain in place after the procedure  for a short time.  stent may be removed in the office with a cystoscope or patient may be instructed to remove the stent themselves by the string described "stent pain" as feelings of needing to urinate/overactive bladder and a warm, tingling sensation to intense pain in the affected flank residual stones within the kidney or ureter may be present after the procedure and may need to have these addressed at a different encounter injury to the ureter is the most common intra-operative risk, it may result in an open procedure to correct the defect infection and bleeding are also risks explained the risks of general anesthesia, such as: MI, CVA, paralysis, coma and/or death. Explained to patient that if his urine looks infected during the procedure, only a ureteral stent will be placed and he will need to be on 2 weeks of antibiotics and then taken back to the operating room to address the stones UA sent for culture patient started on Septra DS patient prescribed tamsulosin 0.4 mg daily patient prescribed Vicodin 5/325, # 10 prn q 6 hours for pain patient prescribed Zofran 4 mg prn q 8 hours for nausea advised to contact our office or seek treatment in the ED if becomes febrile or pain/ vomiting are difficult control in order to arrange for emergent/urgent intervention Patient voiced his understanding and wishes to proceed   2. High risk hematuria Work-up in March 2017 + for nephrolithiasis No reports of gross hematuria Today's UA positive for micro heme - likely due to nephrolithiasis  Return for tomorrow for right urs/LL/ureteral stent placement .  These notes generated with voice recognition software. I apologize for typographical errors.  Zara Council, PA-C  [facility] [address]  [place], [place] [address] [phone]

## 2021-01-31 NOTE — Progress Notes (Signed)
01/31/2021 1:23 PM   Roger Chang. 1961/07/23 [id number]  Referring provider: Jerrol Banana., MD [address] [place],  [place] [address]  Chief Complaint  Patient presents with  . Follow-up    Lower right flank pain   Urological history: 1. Nephrolithiasis - ESWL 2017 for left renal stone   2. High risk hematuria - non-smoker - CTU 2017 3 calculi within the left renal collecting system. The largest calculus measuring 5 mm is positioned within the renal pelvis with the patient supine, although drops into the UPJ with the patient prone. This may be intermittently obstructing - cysto 2017 NED - UA > 30 RBC's - likely due to stones  3. BPH with LU TS - PSA 1.2 in 09/2020 - I PSS 0/0  4. ED - SHIM 23 - contributing factors of age, BPH, HTN, HLD and sleep apnea   HPI: Roger Chang. is a 60 y.o. male who presents today for an urgent visit for right sided flank pain.   He has been experiencing 2 weeks of right-sided pain associated with nausea and vomiting.  He is also been experiencing urinary frequency.   Patient denies any modifying or aggravating factors.  Patient denies any gross hematuria, dysuria or suprapubic.  Patient denies any fevers or chills.  UA > 30 RBC's and moderate bacteria.    PMH: Past Medical History:  Diagnosis Date  . Dyspnea   . Hypertension    Unspecified  . Microscopic hematuria 03/2016  . Sleep apnea    CPAP    Surgical History: Past Surgical History:  Procedure Laterality Date  . CHOLECYSTECTOMY    . COLONOSCOPY WITH PROPOFOL N/A 10/18/2019   Procedure: COLONOSCOPY WITH PROPOFOL;  Surgeon: Virgel Manifold, MD;  Location: [facility];  Service: Endoscopy;  Laterality: N/A;  sleep apnea  . EXTRACORPOREAL SHOCK WAVE LITHOTRIPSY Left 04/10/2016   Procedure: EXTRACORPOREAL SHOCK WAVE LITHOTRIPSY (ESWL);  Surgeon: Nickie Retort, MD;  Location: [facility];  Service: Urology;  Laterality: Left;  .  HEMORRHOID SURGERY  2000  . LASIK    . VASECTOMY  1999    Home Medications:  Allergies as of 01/31/2021      Reactions   Lisinopril Swelling   Swelling of the lips and dry mouth   Amlodipine Swelling   Swelling in extremeties more than mild, pt was not comfortable with that   Hydrochlorothiazide Swelling   Swelling of the foot, possibly contributed to developing gout, felt dehydrated, B/P not controlled   Ambien [zolpidem]    Very groggy and confused the morning after   Losartan Other (See Comments)   Weird dreams, ED, swelling      Medication List       Accurate as of January 31, 2021  1:23 PM. If you have any questions, ask your nurse or doctor.        cholecalciferol 1000 units tablet Commonly known as: VITAMIN D Take by mouth.   diltiazem 180 MG 24 hr capsule Commonly known as: TIAZAC Take by mouth.   diltiazem 180 MG 24 hr capsule Commonly known as: CARDIZEM CD TAKE 1 CAPSULE BY MOUTH EVERY DAY   eszopiclone 2 MG Tabs tablet Commonly known as: LUNESTA TAKE 1 TABLET BY MOUTH EVERYDAY AT BEDTIME   HYDROcodone-acetaminophen 5-325 MG tablet Commonly known as: NORCO/VICODIN Take 1 tablet by mouth every 6 (six) hours as needed for moderate pain. Started by: Zara Council, PA-C   labetalol 200  MG tablet Commonly known as: NORMODYNE Take 1 tablet (200 mg total) by mouth daily.   ondansetron 4 MG tablet Commonly known as: Zofran Take 1 tablet (4 mg total) by mouth every 8 (eight) hours as needed for nausea or vomiting. Started by: Zara Council, PA-C   pravastatin 40 MG tablet Commonly known as: PRAVACHOL Take 1 tablet (40 mg total) by mouth daily.   rOPINIRole 2 MG tablet Commonly known as: REQUIP TAKE 1/2 TO 1 TABLET BY MOUTH AT BEDTIME   sulfamethoxazole-trimethoprim 800-160 MG tablet Commonly known as: BACTRIM DS Take 1 tablet by mouth every 12 (twelve) hours. Started by: Zara Council, PA-C   tamsulosin 0.4 MG Caps capsule Commonly known  as: FLOMAX Take 1 capsule (0.4 mg total) by mouth daily. Started by: Zara Council, PA-C       Allergies:  Allergies  Allergen Reactions  . Lisinopril Swelling    Swelling of the lips and dry mouth  . Amlodipine Swelling    Swelling in extremeties more than mild, pt was not comfortable with that  . Hydrochlorothiazide Swelling    Swelling of the foot, possibly contributed to developing gout, felt dehydrated, B/P not controlled  . Ambien [Zolpidem]     Very groggy and confused the morning after  . Losartan Other (See Comments)    Weird dreams, ED, swelling    Family History: Family History  Problem Relation Age of Onset  . Breast cancer Mother   . Heart attack Father   . Lung cancer Father   . Cancer Other   . Kidney disease Neg Hx   . Prostate cancer Neg Hx   . Kidney cancer Neg Hx   . Bladder Cancer Neg Hx     Social History:  reports that he has never smoked. He has never used smokeless tobacco. He reports current alcohol use of about 6.0 standard drinks of alcohol per week. He reports that he does not use drugs.  ROS: Pertinent ROS in HPI  Physical Exam: BP 138/83   Pulse 75   Ht 5' 8" (1.727 m)   Wt 200 lb (90.7 kg)   BMI 30.41 kg/m   Constitutional:  Well nourished. Alert and oriented, No acute distress. HEENT: [place] AT, mask in place.  Trachea midline Cardiovascular: No clubbing, cyanosis, or edema. Respiratory: Normal respiratory effort, no increased work of breathing. Neurologic: Grossly intact, no focal deficits, moving all 4 extremities. Psychiatric: Normal mood and affect.  Laboratory Data: Lab Results  Component Value Date   WBC 4.4 10/09/2020   HGB 16.9 10/09/2020   HCT 50.4 10/09/2020   MCV 91 10/09/2020   PLT 274 10/09/2020    Lab Results  Component Value Date   CREATININE 1.39 (H) 10/09/2020    Lab Results  Component Value Date   PSA 1.0 01/17/2015   Component     Latest Ref Rng & Units 02/20/2016 02/19/2017 03/02/2018 10/03/2019   Prostate Specific Ag, Serum     0.0 - 4.0 ng/mL 1.0 0.8 0.9 0.8   Component     Latest Ref Rng & Units 10/09/2020  Prostate Specific Ag, Serum     0.0 - 4.0 ng/mL 1.2   Lab Results  Component Value Date   HGBA1C 6.0 (H) 10/09/2020    Lab Results  Component Value Date   TSH 1.510 10/03/2019       Component Value Date/Time   CHOL 224 (H) 10/09/2020 1012   HDL 44 10/09/2020 1012   CHOLHDL 5.1 (H) 10/09/2020  1012   LDLCALC 145 (H) 10/09/2020 1012    Lab Results  Component Value Date   AST 24 10/09/2020   Lab Results  Component Value Date   ALT 25 10/09/2020    Urinalysis Component     Latest Ref Rng & Units 01/31/2021  Specific Gravity, UA     1.005 - 1.030 1.020  pH, UA     5.0 - 7.5 5.5  Color, UA     Yellow Yellow  Appearance Ur     Clear Clear  Leukocytes,UA     Negative Trace (A)  Protein,UA     Negative/Trace 1+ (A)  Glucose, UA     Negative Negative  Ketones, UA     Negative Trace (A)  RBC, UA     Negative 3+ (A)  Bilirubin, UA     Negative Negative  Urobilinogen, Ur     0.2 - 1.0 mg/dL 0.2  Nitrite, UA     Negative Negative  Microscopic Examination      See below:   Component     Latest Ref Rng & Units 01/31/2021  WBC, UA     0 - 5 /hpf 0-5  RBC     0 - 2 /hpf >30 (A)  Epithelial Cells (non renal)     0 - 10 /hpf 0-10  Casts     None seen /lpf Present (A)  Cast Type     N/A Granular casts (A)  Mucus, UA     Not Estab. Present (A)  Bacteria, UA     None seen/Few Many (A)  I have reviewed the labs.  Pertinent Imaging: Narrative & Impression  CLINICAL DATA:  Right flank pain, nephrolithiasis  EXAM: CT ABDOMEN AND PELVIS WITHOUT CONTRAST  TECHNIQUE: Multidetector CT imaging of the abdomen and pelvis was performed following the standard protocol without IV contrast.  COMPARISON:  03/05/2016  FINDINGS: Lower chest: The visualized lung bases are clear bilaterally. The visualized heart and pericardium are  unremarkable.  Hepatobiliary: No focal liver abnormality is seen. Status post cholecystectomy. No biliary dilatation.  Pancreas: Unremarkable  Spleen: Unremarkable  Adrenals/Urinary Tract: The adrenal glands are unremarkable. The kidneys are normal in size and position. There is mild right hydronephrosis secondary to an obstructing 2 mm x 4 mm x 4 mm calculus within the proximal right ureter at the level of the transverse process of L4. There is superimposed bilateral minimal nonobstructing nephrolithiasis with at least 3 left-sided and 2 right-sided nonobstructing calculi measuring up to 2 mm. Previously noted dominant 5 mm nonobstructing calculus within the left kidney is no longer visualized and has presumably passed in the interval. There is no hydronephrosis on the left. The bladder is decompressed and is unremarkable.  Stomach/Bowel: Mild sigmoid diverticulosis. The stomach, small bowel, and large bowel are otherwise unremarkable. No evidence of obstruction or focal inflammation. No free intraperitoneal gas or fluid. The appendix is not clearly identified, however, there are no secondary signs of appendicitis within the right lower quadrant.  Vascular/Lymphatic: Mild atherosclerotic calcification within the abdominal aorta. No aortic aneurysm identified. No pathologic adenopathy within the abdomen and pelvis.  Reproductive: Prostate is unremarkable. Small bilateral varicoceles are incidentally noted within the scrotum.  Other: Broad-based tiny umbilical hernia contains a single loop of unremarkable small bowel.  Musculoskeletal: No acute bone abnormality. Degenerative changes are seen within the lumbar spine.  IMPRESSION: Obstructing 2 mm x 4 mm x 4 mm calculus within the proximal right ureter resulting in mild right   hydronephrosis. Superimposed minimal bilateral nonobstructing nephrolithiasis.  Mild sigmoid diverticulosis.  Small bilateral  varicoceles  Aortic Atherosclerosis (ICD10-I70.0).   Electronically Signed   By: Ashesh  Parikh MD   On: 01/31/2021 12:42    CLINICAL DATA:  Nephrolithiasis  EXAM: ABDOMEN - 1 VIEW  COMPARISON:  None. Findings are correlated with concurrently performed CT examination of the abdomen and pelvis.  FINDINGS: 3 mm calculus overlies the lower pole of the right kidney. Known right proximal ureteral calculus is not well visualized on this examination. The majority of tiny nonobstructing calculi noted bilaterally on recent CT examination are not identified on this exam. Normal abdominal gas pattern. Multiple phleboliths within the pelvis. Degenerative changes seen within the lumbar spine.  IMPRESSION: Minimal right nephrolithiasis. The majority of nonobstructing calculi and the obstructing right ureteral calculus seen on recent CT examination are not visualized on this exam.   Electronically Signed   By: Ashesh  Parikh MD   On: 01/31/2021 12:44 I have independently reviewed the films.  See HPI.   Assessment & Plan:    1. Right ureteral/renal stones: CT Renal stone study obstructing 2 mm x 4 mm x 4 mm calculus within the proximal right ureter resulting in mild right hydronephrosis. Superimposed minimal bilateral nonobstructing nephrolithiasis. Explained to the patient that since their stone is  ?10 mm, it is in within AUA Guidelines to continue MET with tamsulosin and pushing fluids for 4 to 6 weeks Patient is not wanting to pursue MET therapy at this time, I explained that URS would be the best therapy as URS has a greater stone-free rate in a single procedure and the stone is not visible on KUB Schedule right ureteroscopy with laser lithotripsy and ureteral stent placement explained to the patient how the procedure is performed and the risks involved informed patient that they will have a stent placed during the procedure and will remain in place after the procedure  for a short time.  stent may be removed in the office with a cystoscope or patient may be instructed to remove the stent themselves by the string described "stent pain" as feelings of needing to urinate/overactive bladder and a warm, tingling sensation to intense pain in the affected flank residual stones within the kidney or ureter may be present after the procedure and may need to have these addressed at a different encounter injury to the ureter is the most common intra-operative risk, it may result in an open procedure to correct the defect infection and bleeding are also risks explained the risks of general anesthesia, such as: MI, CVA, paralysis, coma and/or death. Explained to patient that if his urine looks infected during the procedure, only a ureteral stent will be placed and he will need to be on 2 weeks of antibiotics and then taken back to the operating room to address the stones UA sent for culture patient started on Septra DS patient prescribed tamsulosin 0.4 mg daily patient prescribed Vicodin 5/325, # 10 prn q 6 hours for pain patient prescribed Zofran 4 mg prn q 8 hours for nausea advised to contact our office or seek treatment in the ED if becomes febrile or pain/ vomiting are difficult control in order to arrange for emergent/urgent intervention Patient voiced his understanding and wishes to proceed   2. High risk hematuria Work-up in March 2017 + for nephrolithiasis No reports of gross hematuria Today's UA positive for micro heme - likely due to nephrolithiasis  Return for tomorrow for right urs/LL/ureteral stent placement .    These notes generated with voice recognition software. I apologize for typographical errors.  Zara Council, PA-C  [facility] [address]  [place], [place] [address] [phone]

## 2021-01-31 NOTE — H&P (View-Only) (Signed)
01/31/2021 1:23 PM   Roger Chang. 1961/07/23 [id number]  Referring provider: Jerrol Banana., MD [address] [place],  [place] [address]  Chief Complaint  Patient presents with  . Follow-up    Lower right flank pain   Urological history: 1. Nephrolithiasis - ESWL 2017 for left renal stone   2. High risk hematuria - non-smoker - CTU 2017 3 calculi within the left renal collecting system. The largest calculus measuring 5 mm is positioned within the renal pelvis with the patient supine, although drops into the UPJ with the patient prone. This may be intermittently obstructing - cysto 2017 NED - UA > 30 RBC's - likely due to stones  3. BPH with LU TS - PSA 1.2 in 09/2020 - I PSS 0/0  4. ED - SHIM 23 - contributing factors of age, BPH, HTN, HLD and sleep apnea   HPI: Roger Chang. is a 60 y.o. male who presents today for an urgent visit for right sided flank pain.   He has been experiencing 2 weeks of right-sided pain associated with nausea and vomiting.  He is also been experiencing urinary frequency.   Patient denies any modifying or aggravating factors.  Patient denies any gross hematuria, dysuria or suprapubic.  Patient denies any fevers or chills.  UA > 30 RBC's and moderate bacteria.    PMH: Past Medical History:  Diagnosis Date  . Dyspnea   . Hypertension    Unspecified  . Microscopic hematuria 03/2016  . Sleep apnea    CPAP    Surgical History: Past Surgical History:  Procedure Laterality Date  . CHOLECYSTECTOMY    . COLONOSCOPY WITH PROPOFOL N/A 10/18/2019   Procedure: COLONOSCOPY WITH PROPOFOL;  Surgeon: Virgel Manifold, MD;  Location: [facility];  Service: Endoscopy;  Laterality: N/A;  sleep apnea  . EXTRACORPOREAL SHOCK WAVE LITHOTRIPSY Left 04/10/2016   Procedure: EXTRACORPOREAL SHOCK WAVE LITHOTRIPSY (ESWL);  Surgeon: Nickie Retort, MD;  Location: [facility];  Service: Urology;  Laterality: Left;  .  HEMORRHOID SURGERY  2000  . LASIK    . VASECTOMY  1999    Home Medications:  Allergies as of 01/31/2021      Reactions   Lisinopril Swelling   Swelling of the lips and dry mouth   Amlodipine Swelling   Swelling in extremeties more than mild, pt was not comfortable with that   Hydrochlorothiazide Swelling   Swelling of the foot, possibly contributed to developing gout, felt dehydrated, B/P not controlled   Ambien [zolpidem]    Very groggy and confused the morning after   Losartan Other (See Comments)   Weird dreams, ED, swelling      Medication List       Accurate as of January 31, 2021  1:23 PM. If you have any questions, ask your nurse or doctor.        cholecalciferol 1000 units tablet Commonly known as: VITAMIN D Take by mouth.   diltiazem 180 MG 24 hr capsule Commonly known as: TIAZAC Take by mouth.   diltiazem 180 MG 24 hr capsule Commonly known as: CARDIZEM CD TAKE 1 CAPSULE BY MOUTH EVERY DAY   eszopiclone 2 MG Tabs tablet Commonly known as: LUNESTA TAKE 1 TABLET BY MOUTH EVERYDAY AT BEDTIME   HYDROcodone-acetaminophen 5-325 MG tablet Commonly known as: NORCO/VICODIN Take 1 tablet by mouth every 6 (six) hours as needed for moderate pain. Started by: Zara Council, PA-C   labetalol 200  MG tablet Commonly known as: NORMODYNE Take 1 tablet (200 mg total) by mouth daily.   ondansetron 4 MG tablet Commonly known as: Zofran Take 1 tablet (4 mg total) by mouth every 8 (eight) hours as needed for nausea or vomiting. Started by: Zara Council, PA-C   pravastatin 40 MG tablet Commonly known as: PRAVACHOL Take 1 tablet (40 mg total) by mouth daily.   rOPINIRole 2 MG tablet Commonly known as: REQUIP TAKE 1/2 TO 1 TABLET BY MOUTH AT BEDTIME   sulfamethoxazole-trimethoprim 800-160 MG tablet Commonly known as: BACTRIM DS Take 1 tablet by mouth every 12 (twelve) hours. Started by: Zara Council, PA-C   tamsulosin 0.4 MG Caps capsule Commonly known  as: FLOMAX Take 1 capsule (0.4 mg total) by mouth daily. Started by: Zara Council, PA-C       Allergies:  Allergies  Allergen Reactions  . Lisinopril Swelling    Swelling of the lips and dry mouth  . Amlodipine Swelling    Swelling in extremeties more than mild, pt was not comfortable with that  . Hydrochlorothiazide Swelling    Swelling of the foot, possibly contributed to developing gout, felt dehydrated, B/P not controlled  . Ambien [Zolpidem]     Very groggy and confused the morning after  . Losartan Other (See Comments)    Weird dreams, ED, swelling    Family History: Family History  Problem Relation Age of Onset  . Breast cancer Mother   . Heart attack Father   . Lung cancer Father   . Cancer Other   . Kidney disease Neg Hx   . Prostate cancer Neg Hx   . Kidney cancer Neg Hx   . Bladder Cancer Neg Hx     Social History:  reports that he has never smoked. He has never used smokeless tobacco. He reports current alcohol use of about 6.0 standard drinks of alcohol per week. He reports that he does not use drugs.  ROS: Pertinent ROS in HPI  Physical Exam: BP 138/83   Pulse 75   Ht 5' 8" (1.727 m)   Wt 200 lb (90.7 kg)   BMI 30.41 kg/m   Constitutional:  Well nourished. Alert and oriented, No acute distress. HEENT: [place] AT, mask in place.  Trachea midline Cardiovascular: No clubbing, cyanosis, or edema. Respiratory: Normal respiratory effort, no increased work of breathing. Neurologic: Grossly intact, no focal deficits, moving all 4 extremities. Psychiatric: Normal mood and affect.  Laboratory Data: Lab Results  Component Value Date   WBC 4.4 10/09/2020   HGB 16.9 10/09/2020   HCT 50.4 10/09/2020   MCV 91 10/09/2020   PLT 274 10/09/2020    Lab Results  Component Value Date   CREATININE 1.39 (H) 10/09/2020    Lab Results  Component Value Date   PSA 1.0 01/17/2015   Component     Latest Ref Rng & Units 02/20/2016 02/19/2017 03/02/2018 10/03/2019   Prostate Specific Ag, Serum     0.0 - 4.0 ng/mL 1.0 0.8 0.9 0.8   Component     Latest Ref Rng & Units 10/09/2020  Prostate Specific Ag, Serum     0.0 - 4.0 ng/mL 1.2   Lab Results  Component Value Date   HGBA1C 6.0 (H) 10/09/2020    Lab Results  Component Value Date   TSH 1.510 10/03/2019       Component Value Date/Time   CHOL 224 (H) 10/09/2020 1012   HDL 44 10/09/2020 1012   CHOLHDL 5.1 (H) 10/09/2020  [place] 145 (H) 10/09/2020 1012    Lab Results  Component Value Date   AST 24 10/09/2020   Lab Results  Component Value Date   ALT 25 10/09/2020    Urinalysis Component     Latest Ref Rng & Units 01/31/2021  Specific Gravity, UA     1.005 - 1.030 1.020  pH, UA     5.0 - 7.5 5.5  Color, UA     Yellow Yellow  Appearance Ur     Clear Clear  Leukocytes,UA     Negative Trace (A)  Protein,UA     Negative/Trace 1+ (A)  Glucose, UA     Negative Negative  Ketones, UA     Negative Trace (A)  RBC, UA     Negative 3+ (A)  Bilirubin, UA     Negative Negative  Urobilinogen, Ur     0.2 - 1.0 mg/dL 0.2  Nitrite, UA     Negative Negative  Microscopic Examination      See below:   Component     Latest Ref Rng & Units 01/31/2021  WBC, UA     0 - 5 /hpf 0-5  RBC     0 - 2 /hpf >30 (A)  Epithelial Cells (non renal)     0 - 10 /hpf 0-10  Casts     None seen /lpf Present (A)  Cast Type     N/A Granular casts (A)  Mucus, UA     Not Estab. Present (A)  Bacteria, UA     None seen/Few Many (A)  I have reviewed the labs.  Pertinent Imaging: Narrative & Impression  CLINICAL DATA:  Right flank pain, nephrolithiasis  EXAM: CT ABDOMEN AND PELVIS WITHOUT CONTRAST  TECHNIQUE: Multidetector CT imaging of the abdomen and pelvis was performed following the standard protocol without IV contrast.  COMPARISON:  03/05/2016  FINDINGS: Lower chest: The visualized lung bases are clear bilaterally. The visualized heart and pericardium are  unremarkable.  Hepatobiliary: No focal liver abnormality is seen. Status post cholecystectomy. No biliary dilatation.  Pancreas: Unremarkable  Spleen: Unremarkable  Adrenals/Urinary Tract: The adrenal glands are unremarkable. The kidneys are normal in size and position. There is mild right hydronephrosis secondary to an obstructing 2 mm x 4 mm x 4 mm calculus within the proximal right ureter at the level of the transverse process of L4. There is superimposed bilateral minimal nonobstructing nephrolithiasis with at least 3 left-sided and 2 right-sided nonobstructing calculi measuring up to 2 mm. Previously noted dominant 5 mm nonobstructing calculus within the left kidney is no longer visualized and has presumably passed in the interval. There is no hydronephrosis on the left. The bladder is decompressed and is unremarkable.  Stomach/Bowel: Mild sigmoid diverticulosis. The stomach, small bowel, and large bowel are otherwise unremarkable. No evidence of obstruction or focal inflammation. No free intraperitoneal gas or fluid. The appendix is not clearly identified, however, there are no secondary signs of appendicitis within the right lower quadrant.  Vascular/Lymphatic: Mild atherosclerotic calcification within the abdominal aorta. No aortic aneurysm identified. No pathologic adenopathy within the abdomen and pelvis.  Reproductive: Prostate is unremarkable. Small bilateral varicoceles are incidentally noted within the scrotum.  Other: Broad-based tiny umbilical hernia contains a single loop of unremarkable small bowel.  Musculoskeletal: No acute bone abnormality. Degenerative changes are seen within the lumbar spine.  IMPRESSION: Obstructing 2 mm x 4 mm x 4 mm calculus within the proximal right ureter resulting in mild right  hydronephrosis. Superimposed minimal bilateral nonobstructing nephrolithiasis.  Mild sigmoid diverticulosis.  Small bilateral  varicoceles  Aortic Atherosclerosis (ICD10-I70.0).   Electronically Signed   By: Ashesh  Parikh MD   On: 01/31/2021 12:42    CLINICAL DATA:  Nephrolithiasis  EXAM: ABDOMEN - 1 VIEW  COMPARISON:  None. Findings are correlated with concurrently performed CT examination of the abdomen and pelvis.  FINDINGS: 3 mm calculus overlies the lower pole of the right kidney. Known right proximal ureteral calculus is not well visualized on this examination. The majority of tiny nonobstructing calculi noted bilaterally on recent CT examination are not identified on this exam. Normal abdominal gas pattern. Multiple phleboliths within the pelvis. Degenerative changes seen within the lumbar spine.  IMPRESSION: Minimal right nephrolithiasis. The majority of nonobstructing calculi and the obstructing right ureteral calculus seen on recent CT examination are not visualized on this exam.   Electronically Signed   By: Ashesh  Parikh MD   On: 01/31/2021 12:44 I have independently reviewed the films.  See HPI.   Assessment & Plan:    1. Right ureteral/renal stones: CT Renal stone study obstructing 2 mm x 4 mm x 4 mm calculus within the proximal right ureter resulting in mild right hydronephrosis. Superimposed minimal bilateral nonobstructing nephrolithiasis. Explained to the patient that since their stone is  ?10 mm, it is in within AUA Guidelines to continue MET with tamsulosin and pushing fluids for 4 to 6 weeks Patient is not wanting to pursue MET therapy at this time, I explained that URS would be the best therapy as URS has a greater stone-free rate in a single procedure and the stone is not visible on KUB Schedule right ureteroscopy with laser lithotripsy and ureteral stent placement explained to the patient how the procedure is performed and the risks involved informed patient that they will have a stent placed during the procedure and will remain in place after the procedure  for a short time.  stent may be removed in the office with a cystoscope or patient may be instructed to remove the stent themselves by the string described "stent pain" as feelings of needing to urinate/overactive bladder and a warm, tingling sensation to intense pain in the affected flank residual stones within the kidney or ureter may be present after the procedure and may need to have these addressed at a different encounter injury to the ureter is the most common intra-operative risk, it may result in an open procedure to correct the defect infection and bleeding are also risks explained the risks of general anesthesia, such as: MI, CVA, paralysis, coma and/or death. Explained to patient that if his urine looks infected during the procedure, only a ureteral stent will be placed and he will need to be on 2 weeks of antibiotics and then taken back to the operating room to address the stones UA sent for culture patient started on Septra DS patient prescribed tamsulosin 0.4 mg daily patient prescribed Vicodin 5/325, # 10 prn q 6 hours for pain patient prescribed Zofran 4 mg prn q 8 hours for nausea advised to contact our office or seek treatment in the ED if becomes febrile or pain/ vomiting are difficult control in order to arrange for emergent/urgent intervention Patient voiced his understanding and wishes to proceed   2. High risk hematuria Work-up in March 2017 + for nephrolithiasis No reports of gross hematuria Today's UA positive for micro heme - likely due to nephrolithiasis  Return for tomorrow for right urs/LL/ureteral stent placement .    These notes generated with voice recognition software. I apologize for typographical errors.  Zara Council, PA-C  [facility] [address]  [place], [place] [address] [phone]

## 2021-02-01 ENCOUNTER — Encounter
Admission: RE | Disposition: A | Discharge status: Home / Self Care | Payer: Self-pay | Source: Home / Self Care | Attending: Urology

## 2021-02-01 ENCOUNTER — Ambulatory Visit: Payer: BC Managed Care – PPO | Admitting: Anesthesiology

## 2021-02-01 ENCOUNTER — Ambulatory Visit
Admission: RE | Admit: 2021-02-01 | Discharge: 2021-02-01 | Disposition: A | Discharge status: Home / Self Care | Payer: BC Managed Care – PPO | Attending: Urology | Admitting: Urology

## 2021-02-01 ENCOUNTER — Other Ambulatory Visit: Payer: Self-pay

## 2021-02-01 ENCOUNTER — Encounter: Payer: Self-pay | Admitting: Urology

## 2021-02-01 ENCOUNTER — Ambulatory Visit: Payer: BC Managed Care – PPO

## 2021-02-01 DIAGNOSIS — N200 Calculus of kidney: Secondary | ICD-10-CM

## 2021-02-01 DIAGNOSIS — N3090 Cystitis, unspecified without hematuria: Secondary | ICD-10-CM | POA: Insufficient documentation

## 2021-02-01 DIAGNOSIS — Z888 Allergy status to other drugs, medicaments and biological substances status: Secondary | ICD-10-CM | POA: Insufficient documentation

## 2021-02-01 DIAGNOSIS — Z79899 Other long term (current) drug therapy: Secondary | ICD-10-CM | POA: Diagnosis not present

## 2021-02-01 DIAGNOSIS — N401 Enlarged prostate with lower urinary tract symptoms: Secondary | ICD-10-CM | POA: Insufficient documentation

## 2021-02-01 DIAGNOSIS — N201 Calculus of ureter: Secondary | ICD-10-CM | POA: Diagnosis not present

## 2021-02-01 DIAGNOSIS — N132 Hydronephrosis with renal and ureteral calculous obstruction: Secondary | ICD-10-CM | POA: Diagnosis not present

## 2021-02-01 DIAGNOSIS — R350 Frequency of micturition: Secondary | ICD-10-CM | POA: Insufficient documentation

## 2021-02-01 DIAGNOSIS — N329 Bladder disorder, unspecified: Secondary | ICD-10-CM | POA: Insufficient documentation

## 2021-02-01 HISTORY — PX: FULGURATION OF BLADDER TUMOR: SHX6261

## 2021-02-01 HISTORY — PX: CYSTOSCOPY/URETEROSCOPY/HOLMIUM LASER/STENT PLACEMENT: SHX6546

## 2021-02-01 LAB — SARS CORONAVIRUS 2 (TAT 6-24 HRS): SARS Coronavirus 2: NEGATIVE

## 2021-02-01 SURGERY — CYSTOSCOPY/URETEROSCOPY/HOLMIUM LASER/STENT PLACEMENT
Anesthesia: General | Laterality: Right

## 2021-02-01 MED ORDER — CHLORHEXIDINE GLUCONATE 0.12 % MT SOLN: 15.0000 mL | Freq: Once | OROMUCOSAL | Status: AC

## 2021-02-01 MED ORDER — ROCURONIUM BROMIDE 100 MG/10ML IV SOLN
INTRAVENOUS | Status: DC | PRN
  Administered 2021-02-01: 50 mg via INTRAVENOUS

## 2021-02-01 MED ORDER — FENTANYL CITRATE (PF) 100 MCG/2ML IJ SOLN: 25.0000 ug | INTRAMUSCULAR | Status: DC | PRN

## 2021-02-01 MED ORDER — BELLADONNA ALKALOIDS-OPIUM 16.2-60 MG RE SUPP
RECTAL | Status: DC | PRN
  Administered 2021-02-01: 1 via RECTAL

## 2021-02-01 MED ORDER — MIDAZOLAM HCL 2 MG/2ML IJ SOLN
INTRAMUSCULAR | Status: AC
  Filled 2021-02-01: qty 2

## 2021-02-01 MED ORDER — BELLADONNA ALKALOIDS-OPIUM 16.2-60 MG RE SUPP
RECTAL | Status: AC
  Filled 2021-02-01: qty 1

## 2021-02-01 MED ORDER — SUGAMMADEX SODIUM 500 MG/5ML IV SOLN
INTRAVENOUS | Status: DC | PRN
  Administered 2021-02-01: 250 mg via INTRAVENOUS

## 2021-02-01 MED ORDER — CHLORHEXIDINE GLUCONATE 0.12 % MT SOLN
OROMUCOSAL | Status: AC
  Administered 2021-02-01: 15 mL via OROMUCOSAL
  Filled 2021-02-01: qty 15

## 2021-02-01 MED ORDER — ONDANSETRON HCL 4 MG/2ML IJ SOLN
INTRAMUSCULAR | Status: DC | PRN
  Administered 2021-02-01: 4 mg via INTRAVENOUS

## 2021-02-01 MED ORDER — OXYBUTYNIN CHLORIDE ER 10 MG PO TB24: 10.0000 mg | ORAL_TABLET | Freq: Every day | ORAL | 0 refills | Status: DC | PRN

## 2021-02-01 MED ORDER — ONDANSETRON HCL 4 MG/2ML IJ SOLN: 4.0000 mg | Freq: Once | INTRAMUSCULAR | Status: DC | PRN

## 2021-02-01 MED ORDER — LACTATED RINGERS IV SOLN: INTRAVENOUS | Status: DC

## 2021-02-01 MED ORDER — PROPOFOL 10 MG/ML IV BOLUS
INTRAVENOUS | Status: AC
  Filled 2021-02-01: qty 20

## 2021-02-01 MED ORDER — CEFAZOLIN SODIUM-DEXTROSE 2-4 GM/100ML-% IV SOLN
INTRAVENOUS | Status: AC
  Filled 2021-02-01: qty 100

## 2021-02-01 MED ORDER — CEFAZOLIN SODIUM-DEXTROSE 2-4 GM/100ML-% IV SOLN
2.0000 g | INTRAVENOUS | Status: AC
  Administered 2021-02-01: 2 g via INTRAVENOUS

## 2021-02-01 MED ORDER — FENTANYL CITRATE (PF) 100 MCG/2ML IJ SOLN
INTRAMUSCULAR | Status: DC | PRN
  Administered 2021-02-01 (×2): 50 ug via INTRAVENOUS

## 2021-02-01 MED ORDER — FENTANYL CITRATE (PF) 100 MCG/2ML IJ SOLN
INTRAMUSCULAR | Status: AC
  Filled 2021-02-01: qty 2

## 2021-02-01 MED ORDER — ACETAMINOPHEN 10 MG/ML IV SOLN
INTRAVENOUS | Status: AC
  Filled 2021-02-01: qty 100

## 2021-02-01 MED ORDER — ORAL CARE MOUTH RINSE: 15.0000 mL | Freq: Once | OROMUCOSAL | Status: AC

## 2021-02-01 MED ORDER — ACETAMINOPHEN 10 MG/ML IV SOLN
INTRAVENOUS | Status: DC | PRN
  Administered 2021-02-01: 1000 mg via INTRAVENOUS

## 2021-02-01 MED ORDER — DEXAMETHASONE SODIUM PHOSPHATE 10 MG/ML IJ SOLN
INTRAMUSCULAR | Status: DC | PRN
  Administered 2021-02-01: 5 mg via INTRAVENOUS

## 2021-02-01 MED ORDER — PROPOFOL 10 MG/ML IV BOLUS
INTRAVENOUS | Status: DC | PRN
  Administered 2021-02-01: 100 mg via INTRAVENOUS

## 2021-02-01 MED ORDER — LIDOCAINE HCL (CARDIAC) PF 100 MG/5ML IV SOSY
PREFILLED_SYRINGE | INTRAVENOUS | Status: DC | PRN
  Administered 2021-02-01 (×2): 50 mg via INTRAVENOUS

## 2021-02-01 MED ORDER — MIDAZOLAM HCL 2 MG/2ML IJ SOLN
INTRAMUSCULAR | Status: DC | PRN
  Administered 2021-02-01: 2 mg via INTRAVENOUS

## 2021-02-01 MED ORDER — PHENYLEPHRINE HCL (PRESSORS) 10 MG/ML IV SOLN
INTRAVENOUS | Status: DC | PRN
  Administered 2021-02-01: 100 ug via INTRAVENOUS

## 2021-02-01 MED ORDER — KETOROLAC TROMETHAMINE 15 MG/ML IJ SOLN
INTRAMUSCULAR | Status: DC | PRN
  Administered 2021-02-01: 15 mg via INTRAVENOUS

## 2021-02-01 SURGICAL SUPPLY — 33 items
BAG DRAIN CYSTO-URO LG1000N (MISCELLANEOUS) ×3 IMPLANT
BRUSH SCRUB EZ 1% IODOPHOR (MISCELLANEOUS) ×3 IMPLANT
CATH URET FLEX-TIP 2 LUMEN 10F (CATHETERS) ×3 IMPLANT
CATH URETL 5X70 OPEN END (CATHETERS) ×3 IMPLANT
CNTNR SPEC 2.5X3XGRAD LEK (MISCELLANEOUS)
CONT SPEC 4OZ STER OR WHT (MISCELLANEOUS)
CONT SPEC 4OZ STRL OR WHT (MISCELLANEOUS)
CONTAINER SPEC 2.5X3XGRAD LEK (MISCELLANEOUS) IMPLANT
DRAPE UTILITY 15X26 TOWEL STRL (DRAPES) ×3 IMPLANT
DRSG TEGADERM 2-3/8X2-3/4 SM (GAUZE/BANDAGES/DRESSINGS) ×3 IMPLANT
DRSG TELFA 3X8 NADH (GAUZE/BANDAGES/DRESSINGS) ×3 IMPLANT
GLOVE BIOGEL PI IND STRL 7.5 (GLOVE) ×2 IMPLANT
GLOVE BIOGEL PI INDICATOR 7.5 (GLOVE) ×1
GOWN STRL REUS W/ TWL LRG LVL3 (GOWN DISPOSABLE) ×2 IMPLANT
GOWN STRL REUS W/ TWL XL LVL3 (GOWN DISPOSABLE) ×2 IMPLANT
GOWN STRL REUS W/TWL LRG LVL3 (GOWN DISPOSABLE) ×3
GOWN STRL REUS W/TWL XL LVL3 (GOWN DISPOSABLE) ×3
GUIDEWIRE STR DUAL SENSOR (WIRE) ×3 IMPLANT
INFUSOR MANOMETER BAG 3000ML (MISCELLANEOUS) ×3 IMPLANT
INTRODUCER DILATOR DOUBLE (INTRODUCER) IMPLANT
KIT TURNOVER CYSTO (KITS) ×3 IMPLANT
PACK CYSTO AR (MISCELLANEOUS) ×3 IMPLANT
SET CYSTO W/LG BORE CLAMP LF (SET/KITS/TRAYS/PACK) ×3 IMPLANT
SHEATH URETERAL 12FRX35CM (MISCELLANEOUS) IMPLANT
SOL .9 NS 3000ML IRR  AL (IV SOLUTION) ×3
SOL .9 NS 3000ML IRR UROMATIC (IV SOLUTION) ×2 IMPLANT
STENT URET 6FRX24 CONTOUR (STENTS) IMPLANT
STENT URET 6FRX26 CONTOUR (STENTS) ×3 IMPLANT
SURGILUBE 2OZ TUBE FLIPTOP (MISCELLANEOUS) ×3 IMPLANT
SYR 10ML LL (SYRINGE) ×3 IMPLANT
TRACTIP FLEXIVA PULSE ID 200 (Laser) IMPLANT
VALVE UROSEAL ADJ ENDO (VALVE) IMPLANT
WATER STERILE IRR 1000ML POUR (IV SOLUTION) ×3 IMPLANT

## 2021-02-01 NOTE — Interval H&P Note (Signed)
UROLOGY H&P UPDATE  Agree with prior H&P dated 01/31/2021 by Michiel Cowboy, PA.  60 year old male with 4 mm proximal ureteral stone and poorly controlled pain who opted for ureteroscopy.  He denies any UTI symptoms or fevers or chills.  Cardiac: RRR Lungs: CTA bilaterally  Laterality: Right Procedure: Ureteroscopy, laser lithotripsy, stent placement  Urine: >30 RBCs, 0-5 WBCs, no leukocytes, nitrite negative, bacteria  We specifically discussed the risks ureteroscopy including bleeding, infection/sepsis, stent related symptoms including flank pain/urgency/frequency/incontinence/dysuria, ureteral injury, inability to access stone, or need for staged or additional procedures.   Sondra Come, MD 02/01/2021

## 2021-02-01 NOTE — Discharge Instructions (Signed)

## 2021-02-01 NOTE — Transfer of Care (Signed)
Immediate Anesthesia Transfer of Care Note  Patient: Roger Chang.  Procedure(s) Performed: CYSTOSCOPY/URETEROSCOPY/HOLMIUM LASER/STENT PLACEMENT (Right ) FULGURATION OF BLADDER TUMOR WITH BIOPSY  Patient Location: PACU  Anesthesia Type:General  Level of Consciousness: awake, alert  and oriented  Airway & Oxygen Therapy: Patient Spontanous Breathing  Post-op Assessment: Report given to RN  Post vital signs: Reviewed and stable  Last Vitals:  Vitals Value Taken Time  BP 99/65 02/01/21 1406  Temp    Pulse 77 02/01/21 1406  Resp 18 02/01/21 1406  SpO2 99 % 02/01/21 1406    Last Pain:  Vitals:   02/01/21 1150  TempSrc: Temporal  PainSc: 0-No pain         Complications: No complications documented.

## 2021-02-01 NOTE — Op Note (Signed)
Date of procedure: 02/01/21  Preoperative diagnosis:  1. Right proximal ureteral stone  Postoperative diagnosis:  1. Right proximal ureteral stone 2. Bladder lesion  Procedure: 1. Cystoscopy, right diagnostic ureteroscopy, right retrograde pyelogram with intraoperative interpretation, right ureteral stent placement 2. Bladder biopsy and fulguration  Surgeon: Nickolas Madrid, MD  Anesthesia: General  Complications: None  Intraoperative findings:  1.  Moderate size prostate with very high bladder neck, ureteral orifices very close to bladder neck and difficult to identify 2.  Moderate erythema measuring 2 to 3 cm at right lateral wall just lateral to the right ureteral orifice 3.  Extremely tight right distal ureter, unable to access stone with ureteroscope, stent placed 4.  Bladder wall erythema biopsied and fulgurated  EBL: Minimal  Specimens: Bladder biopsy  Drains: Right 6 French by 26 cm ureteral stent  Indication: Roger Chang. is a 60 y.o. patient with 4 mm right proximal ureteral stone and poorly controlled pain who opted for ureteroscopy.  After reviewing the management options for treatment, they elected to proceed with the above surgical procedure(s). We have discussed the potential benefits and risks of the procedure, side effects of the proposed treatment, the likelihood of the patient achieving the goals of the procedure, and any potential problems that might occur during the procedure or recuperation. Informed consent has been obtained.  Description of procedure:  The patient was taken to the operating room and general anesthesia was induced. SCDs were placed for DVT prophylaxis. The patient was placed in the dorsal lithotomy position, prepped and draped in the usual sterile fashion, and preoperative antibiotics(Ancef) were administered. A preoperative time-out was performed.   A 21 French rigid cystoscope was used to intubate the urethra and a normal-appearing  urethra was followed proximally into the bladder.  There was a moderate size prostate with a very high bladder neck.  There was no median lobe.  Thorough cystoscopy showed 2 to 3 cm of erythema at the right lateral wall.  The ureteral orifices were extremely close to the bladder neck bilaterally.  With the aid of an access catheter, I was able to advance a sensor wire into the right ureteral orifice and up into the kidney under fluoroscopic vision.  A semirigid ureteroscope was advanced alongside the wire, but after 3 to 4 cm the right ureter was extremely tight, and prohibited passage of the scope.  I attempted to pass a dual-lumen ureteral access catheter for gentle dilation, but this met resistance at the same location.  A retrograde pyelogram performed through the access catheter showed a very tight ureter with moderate upstream hydronephrosis.  At this point I opted to place a stent for passive dilation and return in 2 to 3 weeks for definitive management.  The rigid cystoscope was backloaded over the wire, and a 6 [place] by 26 cm ureteral stent was uneventfully placed with a curl in the upper pole, as well as under direct vision the bladder.  There was brisk drainage of urine through the side ports of the stent.  With the significant bladder erythema, I opted to perform a bladder biopsy.  A cold cup biopsy forcep was used to biopsy the bladder wall erythema lateral to the right ureteral orifice.  The Bugbee was used for fulguration and meticulous hemostasis was achieved.  With the bladder decompressed there was no bleeding noted.  The bladder was drained and this concluded our procedure.  A belladonna suppository was placed.  Disposition: Stable to PACU  Plan: Schedule repeat  ureteroscopy in 2 to 3 weeks for definitive stone management Follow-up bladder biopsy  Nickolas Madrid, MD

## 2021-02-01 NOTE — Anesthesia Preprocedure Evaluation (Addendum)
Anesthesia Evaluation  Patient identified by MRN, date of birth, ID band Patient awake    Reviewed: Allergy & Precautions, NPO status , Patient's Chart, lab work & pertinent test results  History of Anesthesia Complications Negative for: history of anesthetic complications  Airway Mallampati: I       Dental   Pulmonary sleep apnea , neg COPD, Not current smoker,           Cardiovascular hypertension, Pt. on medications (-) Past MI and (-) CHF (-) dysrhythmias (-) Valvular Problems/Murmurs     Neuro/Psych neg Seizures    GI/Hepatic Neg liver ROS, neg GERD  ,  Endo/Other  neg diabetes  Renal/GU Renal disease (stones)     Musculoskeletal   Abdominal   Peds  Hematology   Anesthesia Other Findings   Reproductive/Obstetrics                            Anesthesia Physical Anesthesia Plan  ASA: II  Anesthesia Plan: General   Post-op Pain Management:    Induction: Intravenous  PONV Risk Score and Plan: 2  Airway Management Planned: Oral ETT  Additional Equipment:   Intra-op Plan:   Post-operative Plan:   Informed Consent: I have reviewed the patients History and Physical, chart, labs and discussed the procedure including the risks, benefits and alternatives for the proposed anesthesia with the patient or authorized representative who has indicated his/her understanding and acceptance.       Plan Discussed with:   Anesthesia Plan Comments:         Anesthesia Quick Evaluation

## 2021-02-01 NOTE — Anesthesia Procedure Notes (Addendum)
Procedure Name: Intubation Date/Time: 02/01/2021 1:27 PM Performed by: Louann Sjogren, CRNA Pre-anesthesia Checklist: Patient identified, Patient being monitored, Timeout performed, Emergency Drugs available and Suction available Patient Re-evaluated:Patient Re-evaluated prior to induction Oxygen Delivery Method: Circle system utilized Preoxygenation: Pre-oxygenation with 100% oxygen Induction Type: IV induction Ventilation: Mask ventilation without difficulty Laryngoscope Size: Mac and 4 Grade View: Grade II Tube type: Oral Tube size: 7.5 mm Number of attempts: 1 Airway Equipment and Method: Stylet Placement Confirmation: ETT inserted through vocal cords under direct vision,  positive ETCO2 and breath sounds checked- equal and bilateral Secured at: 21 cm Tube secured with: Tape Dental Injury: Teeth and Oropharynx as per pre-operative assessment

## 2021-02-01 NOTE — Anesthesia Postprocedure Evaluation (Signed)
Anesthesia Post Note  Patient: [name].  Procedure(s) Performed: CYSTOSCOPY/URETEROSCOPY/HOLMIUM LASER/STENT PLACEMENT (Right ) FULGURATION OF BLADDER TUMOR WITH BIOPSY  Patient location during evaluation: Endoscopy Anesthesia Type: General Level of consciousness: awake and alert Pain management: pain level controlled Vital Signs Assessment: post-procedure vital signs reviewed and stable Respiratory status: spontaneous breathing, nonlabored ventilation, respiratory function stable and patient connected to nasal cannula oxygen Cardiovascular status: blood pressure returned to baseline and stable Postop Assessment: no apparent nausea or vomiting Anesthetic complications: no   No complications documented.   Last Vitals:  Vitals:   02/01/21 1430 02/01/21 1443  BP: 133/84 137/81  Pulse: 71 67  Resp: (!) 21 18  Temp: (!) 36.2 C (!) 36.2 C  SpO2: 99% 95%    Last Pain:  Vitals:   02/01/21 1443  TempSrc: Temporal  PainSc: 0-No pain                 Corinda Gubler

## 2021-02-02 ENCOUNTER — Encounter: Payer: Self-pay | Admitting: Urology

## 2021-02-05 LAB — SURGICAL PATHOLOGY

## 2021-02-06 ENCOUNTER — Other Ambulatory Visit: Payer: Self-pay | Admitting: *Deleted

## 2021-02-06 DIAGNOSIS — N200 Calculus of kidney: Secondary | ICD-10-CM

## 2021-02-06 LAB — CULTURE, URINE COMPREHENSIVE

## 2021-02-07 ENCOUNTER — Other Ambulatory Visit: Payer: Self-pay

## 2021-02-07 ENCOUNTER — Other Ambulatory Visit: Payer: BC Managed Care – PPO

## 2021-02-07 DIAGNOSIS — N200 Calculus of kidney: Secondary | ICD-10-CM

## 2021-02-10 LAB — CULTURE, URINE COMPREHENSIVE

## 2021-02-11 ENCOUNTER — Other Ambulatory Visit: Payer: Self-pay | Admitting: Urology

## 2021-02-11 DIAGNOSIS — N200 Calculus of kidney: Secondary | ICD-10-CM

## 2021-02-12 ENCOUNTER — Other Ambulatory Visit: Payer: Self-pay

## 2021-02-12 ENCOUNTER — Other Ambulatory Visit
Admission: RE | Admit: 2021-02-12 | Discharge: 2021-02-12 | Disposition: A | Discharge status: Home / Self Care | Payer: BC Managed Care – PPO | Source: Ambulatory Visit | Attending: Urology | Admitting: Urology

## 2021-02-12 HISTORY — DX: Personal history of urinary calculi: Z87.442

## 2021-02-12 NOTE — Patient Instructions (Signed)
Your procedure is scheduled on: Friday February 15, 2021. Report to Day [facility] inside [facility] 2nd floor (stop by Admitting Desk first before getting on the elevator). To find out your arrival time please call [phone] between 1PM - 3PM on Thursday February 14, 2021.  Remember: Instructions that are not followed completely may result in serious medical risk,  up to and including death, or upon the discretion of your surgeon and anesthesiologist your  surgery may need to be rescheduled.     _X__ 1. Do not eat food or drink fluids after midnight the night before your procedure.                 No chewing gum or hard candies.                __X__2.  On the morning of surgery brush your teeth with toothpaste and water, you                may rinse your mouth with mouthwash if you wish.  Do not swallow any toothpaste of mouthwash.     _X__ 3.  No Alcohol for 24 hours before or after surgery.   _X__ 4.  Do Not Smoke or use e-cigarettes For 24 Hours Prior to Your Surgery.                 Do not use any chewable tobacco products for at least 6 hours prior to                 Surgery.  _X__  5.  Do not use any recreational drugs (marijuana, cocaine, heroin, ecstasy, MDMA or other)                For at least one week prior to your surgery.  Combination of these drugs with anesthesia                May have life threatening results.  __X__ 6.  Notify your doctor if there is any change in your medical condition      (cold, fever, infections).     Do not wear jewelry, make-up, hairpins, clips or nail polish. Do not wear lotions, powders, or perfumes. You may wear deodorant. Do not shave 48 hours prior to surgery. Men may shave face and neck. Do not bring valuables to the hospital.    [facility] is not responsible for any belongings or valuables.  Contacts, dentures or bridgework may not be worn into surgery. Leave your suitcase in the car. After surgery it may  be brought to your room. For patients admitted to the hospital, discharge time is determined by your treatment team.   Patients discharged the day of surgery will not be allowed to drive home.   Make arrangements for someone to be with you for the first 24 hours of your Same Day Discharge.   __X__ Take these medicines the morning of surgery with A SIP OF WATER:    1. None   2.   3.   4.  5.  6.  ____ Fleet Enema (as directed)   ____ Use CHG Soap (or wipes) as directed  ____ Use Benzoyl Peroxide Gel as instructed  ____ Use inhalers on the day of surgery  ____ Stop metformin 2 days prior to surgery    ____ Take 1/2 of usual insulin dose the night before surgery. No insulin the morning          of  surgery.   __X__ Stop Anti-inflammatories such as Ibuprofen, Aleve, Advil, naproxen, aspirin and or BC powders.    __X__ Stop supplements until after surgery.    __X__ Do not start any herbal supplements before your procedure.   If you have any questions regarding your pre-procedure instructions,  Please call Pre-admit Testing at [phone].

## 2021-02-13 ENCOUNTER — Other Ambulatory Visit: Admission: RE | Admit: 2021-02-13 | Payer: BC Managed Care – PPO | Source: Ambulatory Visit

## 2021-02-13 ENCOUNTER — Other Ambulatory Visit
Admission: RE | Admit: 2021-02-13 | Discharge: 2021-02-13 | Disposition: A | Discharge status: Home / Self Care | Payer: BC Managed Care – PPO | Source: Ambulatory Visit | Attending: Urology | Admitting: Urology

## 2021-02-13 DIAGNOSIS — I10 Essential (primary) hypertension: Secondary | ICD-10-CM | POA: Insufficient documentation

## 2021-02-13 DIAGNOSIS — N202 Calculus of kidney with calculus of ureter: Secondary | ICD-10-CM | POA: Diagnosis not present

## 2021-02-13 DIAGNOSIS — Z01818 Encounter for other preprocedural examination: Secondary | ICD-10-CM | POA: Insufficient documentation

## 2021-02-13 DIAGNOSIS — Z888 Allergy status to other drugs, medicaments and biological substances status: Secondary | ICD-10-CM | POA: Diagnosis not present

## 2021-02-13 DIAGNOSIS — N200 Calculus of kidney: Secondary | ICD-10-CM | POA: Diagnosis not present

## 2021-02-13 DIAGNOSIS — Z20822 Contact with and (suspected) exposure to covid-19: Secondary | ICD-10-CM | POA: Insufficient documentation

## 2021-02-13 DIAGNOSIS — Z79899 Other long term (current) drug therapy: Secondary | ICD-10-CM | POA: Diagnosis not present

## 2021-02-13 DIAGNOSIS — Z01810 Encounter for preprocedural cardiovascular examination: Secondary | ICD-10-CM | POA: Diagnosis not present

## 2021-02-13 LAB — BASIC METABOLIC PANEL
Anion gap: 9 (ref 5–15)
BUN: 20 mg/dL (ref 8–23)
CO2: 28 mmol/L (ref 22–32)
Calcium: 9.9 mg/dL (ref 8.9–10.3)
Chloride: 101 mmol/L (ref 98–111)
Creatinine, Ser: 1.72 mg/dL — ABNORMAL HIGH (ref 0.61–1.24)
GFR, Estimated: 45 mL/min — ABNORMAL LOW (ref >60)
Glucose, Bld: 90 mg/dL (ref 70–99)
Potassium: 4.2 mmol/L (ref 3.5–5.1)
Sodium: 138 mmol/L (ref 135–145)

## 2021-02-13 LAB — CBC
HCT: 45.2 % (ref 39.0–52.0)
Hemoglobin: 15.4 g/dL (ref 13.0–17.0)
MCH: 30.7 pg (ref 26.0–34.0)
MCHC: 34.1 g/dL (ref 30.0–36.0)
MCV: 90 fL (ref 80.0–100.0)
Platelets: 305 10*3/uL (ref 150–400)
RBC: 5.02 MIL/uL (ref 4.22–5.81)
RDW: 12.7 % (ref 11.5–15.5)
WBC: 5.9 10*3/uL (ref 4.0–10.5)
nRBC: 0 % (ref 0.0–0.2)

## 2021-02-13 LAB — SARS CORONAVIRUS 2 (TAT 6-24 HRS): SARS Coronavirus 2: NEGATIVE

## 2021-02-15 ENCOUNTER — Ambulatory Visit: Payer: BC Managed Care – PPO | Admitting: Certified Registered Nurse Anesthetist

## 2021-02-15 ENCOUNTER — Encounter: Payer: Self-pay | Admitting: Urology

## 2021-02-15 ENCOUNTER — Ambulatory Visit: Payer: BC Managed Care – PPO

## 2021-02-15 ENCOUNTER — Encounter
Admission: RE | Disposition: A | Discharge status: Home / Self Care | Payer: Self-pay | Source: Home / Self Care | Attending: Urology

## 2021-02-15 ENCOUNTER — Ambulatory Visit
Admission: RE | Admit: 2021-02-15 | Discharge: 2021-02-15 | Disposition: A | Discharge status: Home / Self Care | Payer: BC Managed Care – PPO | Attending: Urology | Admitting: Urology

## 2021-02-15 DIAGNOSIS — N201 Calculus of ureter: Secondary | ICD-10-CM

## 2021-02-15 DIAGNOSIS — Z20822 Contact with and (suspected) exposure to covid-19: Secondary | ICD-10-CM | POA: Diagnosis not present

## 2021-02-15 DIAGNOSIS — N202 Calculus of kidney with calculus of ureter: Secondary | ICD-10-CM | POA: Diagnosis not present

## 2021-02-15 DIAGNOSIS — N200 Calculus of kidney: Secondary | ICD-10-CM

## 2021-02-15 DIAGNOSIS — Z888 Allergy status to other drugs, medicaments and biological substances status: Secondary | ICD-10-CM | POA: Insufficient documentation

## 2021-02-15 DIAGNOSIS — Z79899 Other long term (current) drug therapy: Secondary | ICD-10-CM | POA: Insufficient documentation

## 2021-02-15 HISTORY — PX: CYSTOSCOPY/URETEROSCOPY/HOLMIUM LASER/STENT PLACEMENT: SHX6546

## 2021-02-15 SURGERY — CYSTOSCOPY/URETEROSCOPY/HOLMIUM LASER/STENT PLACEMENT
Anesthesia: General | Laterality: Right

## 2021-02-15 MED ORDER — CHLORHEXIDINE GLUCONATE 0.12 % MT SOLN: 15.0000 mL | Freq: Once | OROMUCOSAL | Status: AC

## 2021-02-15 MED ORDER — PROPOFOL 10 MG/ML IV BOLUS
INTRAVENOUS | Status: DC | PRN
  Administered 2021-02-15: 200 mg via INTRAVENOUS

## 2021-02-15 MED ORDER — FENTANYL CITRATE (PF) 100 MCG/2ML IJ SOLN
INTRAMUSCULAR | Status: AC
  Filled 2021-02-15: qty 2

## 2021-02-15 MED ORDER — IOHEXOL 180 MG/ML  SOLN
INTRAMUSCULAR | Status: DC | PRN
  Administered 2021-02-15: 5 mL

## 2021-02-15 MED ORDER — FENTANYL CITRATE (PF) 100 MCG/2ML IJ SOLN
INTRAMUSCULAR | Status: DC | PRN
  Administered 2021-02-15: 50 ug via INTRAVENOUS

## 2021-02-15 MED ORDER — CEFAZOLIN SODIUM-DEXTROSE 2-4 GM/100ML-% IV SOLN
2.0000 g | INTRAVENOUS | Status: AC
  Administered 2021-02-15: 2 g via INTRAVENOUS

## 2021-02-15 MED ORDER — LIDOCAINE HCL (CARDIAC) PF 100 MG/5ML IV SOSY
PREFILLED_SYRINGE | INTRAVENOUS | Status: DC | PRN
  Administered 2021-02-15: 80 mg via INTRAVENOUS

## 2021-02-15 MED ORDER — FAMOTIDINE 20 MG PO TABS: 20.0000 mg | ORAL_TABLET | Freq: Once | ORAL | Status: AC

## 2021-02-15 MED ORDER — MIDAZOLAM HCL 2 MG/2ML IJ SOLN
INTRAMUSCULAR | Status: DC | PRN
  Administered 2021-02-15: 2 mg via INTRAVENOUS

## 2021-02-15 MED ORDER — PROMETHAZINE HCL 25 MG/ML IJ SOLN: 6.2500 mg | INTRAMUSCULAR | Status: DC | PRN

## 2021-02-15 MED ORDER — SUGAMMADEX SODIUM 200 MG/2ML IV SOLN
INTRAVENOUS | Status: DC | PRN
  Administered 2021-02-15 (×2): 200 mg via INTRAVENOUS

## 2021-02-15 MED ORDER — ROCURONIUM BROMIDE 100 MG/10ML IV SOLN
INTRAVENOUS | Status: DC | PRN
  Administered 2021-02-15: 50 mg via INTRAVENOUS

## 2021-02-15 MED ORDER — OXYCODONE HCL 5 MG PO TABS: 5.0000 mg | ORAL_TABLET | Freq: Once | ORAL | Status: DC | PRN

## 2021-02-15 MED ORDER — MIDAZOLAM HCL 2 MG/2ML IJ SOLN
INTRAMUSCULAR | Status: AC
  Filled 2021-02-15: qty 2

## 2021-02-15 MED ORDER — ORAL CARE MOUTH RINSE: 15.0000 mL | Freq: Once | OROMUCOSAL | Status: AC

## 2021-02-15 MED ORDER — LACTATED RINGERS IV SOLN: INTRAVENOUS | Status: DC

## 2021-02-15 MED ORDER — KETOROLAC TROMETHAMINE 30 MG/ML IJ SOLN
INTRAMUSCULAR | Status: DC | PRN
  Administered 2021-02-15: 15 mg via INTRAVENOUS

## 2021-02-15 MED ORDER — MEPERIDINE HCL 50 MG/ML IJ SOLN: 6.2500 mg | INTRAMUSCULAR | Status: DC | PRN

## 2021-02-15 MED ORDER — DEXAMETHASONE SODIUM PHOSPHATE 10 MG/ML IJ SOLN
INTRAMUSCULAR | Status: DC | PRN
  Administered 2021-02-15: 10 mg via INTRAVENOUS

## 2021-02-15 MED ORDER — ACETAMINOPHEN 10 MG/ML IV SOLN
INTRAVENOUS | Status: AC
  Filled 2021-02-15: qty 100

## 2021-02-15 MED ORDER — OXYCODONE HCL 5 MG/5ML PO SOLN: 5.0000 mg | Freq: Once | ORAL | Status: DC | PRN

## 2021-02-15 MED ORDER — FENTANYL CITRATE (PF) 100 MCG/2ML IJ SOLN: 25.0000 ug | INTRAMUSCULAR | Status: DC | PRN

## 2021-02-15 MED ORDER — ACETAMINOPHEN 10 MG/ML IV SOLN
INTRAVENOUS | Status: DC | PRN
  Administered 2021-02-15: 1000 mg via INTRAVENOUS

## 2021-02-15 MED ORDER — FAMOTIDINE 20 MG PO TABS
ORAL_TABLET | ORAL | Status: AC
  Administered 2021-02-15: 20 mg via ORAL
  Filled 2021-02-15: qty 1

## 2021-02-15 MED ORDER — HYDROCODONE-ACETAMINOPHEN 5-325 MG PO TABS: 1.0000 | ORAL_TABLET | Freq: Four times a day (QID) | ORAL | 0 refills | Status: AC | PRN

## 2021-02-15 MED ORDER — ONDANSETRON HCL 4 MG/2ML IJ SOLN
INTRAMUSCULAR | Status: DC | PRN
  Administered 2021-02-15: 4 mg via INTRAVENOUS

## 2021-02-15 MED ORDER — CEFAZOLIN SODIUM-DEXTROSE 2-4 GM/100ML-% IV SOLN
INTRAVENOUS | Status: AC
  Filled 2021-02-15: qty 100

## 2021-02-15 MED ORDER — CHLORHEXIDINE GLUCONATE 0.12 % MT SOLN
OROMUCOSAL | Status: AC
  Administered 2021-02-15: 15 mL via OROMUCOSAL
  Filled 2021-02-15: qty 15

## 2021-02-15 SURGICAL SUPPLY — 33 items
BAG DRAIN CYSTO-URO LG1000N (MISCELLANEOUS) ×2 IMPLANT
BRUSH SCRUB EZ 1% IODOPHOR (MISCELLANEOUS) ×2 IMPLANT
CATH URET FLEX-TIP 2 LUMEN 10F (CATHETERS) ×2 IMPLANT
CATH URETL 5X70 OPEN END (CATHETERS) IMPLANT
CNTNR SPEC 2.5X3XGRAD LEK (MISCELLANEOUS)
CONT SPEC 4OZ STER OR WHT (MISCELLANEOUS)
CONT SPEC 4OZ STRL OR WHT (MISCELLANEOUS)
CONTAINER SPEC 2.5X3XGRAD LEK (MISCELLANEOUS) IMPLANT
DRAPE UTILITY 15X26 TOWEL STRL (DRAPES) ×2 IMPLANT
DRSG TEGADERM 2-3/8X2-3/4 SM (GAUZE/BANDAGES/DRESSINGS) ×2 IMPLANT
GLOVE BIOGEL PI IND STRL 7.5 (GLOVE) ×1 IMPLANT
GLOVE BIOGEL PI INDICATOR 7.5 (GLOVE) ×1
GOWN STRL REUS W/ TWL LRG LVL3 (GOWN DISPOSABLE) ×1 IMPLANT
GOWN STRL REUS W/ TWL XL LVL3 (GOWN DISPOSABLE) ×1 IMPLANT
GOWN STRL REUS W/TWL LRG LVL3 (GOWN DISPOSABLE) ×2
GOWN STRL REUS W/TWL XL LVL3 (GOWN DISPOSABLE) ×2
GUIDEWIRE STR DUAL SENSOR (WIRE) ×2 IMPLANT
INFUSOR MANOMETER BAG 3000ML (MISCELLANEOUS) ×2 IMPLANT
INTRODUCER DILATOR DOUBLE (INTRODUCER) IMPLANT
KIT TURNOVER CYSTO (KITS) ×2 IMPLANT
PACK CYSTO AR (MISCELLANEOUS) ×2 IMPLANT
SET CYSTO W/LG BORE CLAMP LF (SET/KITS/TRAYS/PACK) ×2 IMPLANT
SHEATH URETERAL 12FRX35CM (MISCELLANEOUS) IMPLANT
SOL .9 NS 3000ML IRR  AL (IV SOLUTION) ×2
SOL .9 NS 3000ML IRR AL (IV SOLUTION) ×1
SOL .9 NS 3000ML IRR UROMATIC (IV SOLUTION) ×1 IMPLANT
STENT URET 6FRX24 CONTOUR (STENTS) IMPLANT
STENT URET 6FRX26 CONTOUR (STENTS) ×2 IMPLANT
SURGILUBE 2OZ TUBE FLIPTOP (MISCELLANEOUS) ×2 IMPLANT
SYR 10ML LL (SYRINGE) ×2 IMPLANT
TRACTIP FLEXIVA PULSE ID 200 (Laser) ×2 IMPLANT
VALVE UROSEAL ADJ ENDO (VALVE) IMPLANT
WATER STERILE IRR 1000ML POUR (IV SOLUTION) ×2 IMPLANT

## 2021-02-15 NOTE — Interval H&P Note (Signed)
UROLOGY H&P UPDATE  Agree with prior H&P dated 01/31/2021.  Poorly controlled pain secondary to a 5 mm right proximal ureteral stone.  Previously underwent attempted ureteroscopy and could not access the stone and a stent was placed.  We also biopsied some erythema in the bladder that was benign inflammation.  Cardiac: RRR Lungs: CTA bilaterally  Laterality: Right Procedure: Ureteroscopy, laser lithotripsy, stent placement  Urine: Culture no growth  We specifically discussed the risks ureteroscopy including bleeding, infection/sepsis, stent related symptoms including flank pain/urgency/frequency/incontinence/dysuria, ureteral injury, inability to access stone, or need for staged or additional procedures.   Sondra Come, MD 02/15/2021

## 2021-02-15 NOTE — Op Note (Signed)
Date of procedure: 02/15/21  Preoperative diagnosis:  1. Right proximal ureteral stone  Postoperative diagnosis:  1. Right renal stones  Procedure: 1. Cystoscopy, right ureteroscopy, laser lithotripsy, right retrograde pyelogram with intraoperative interpretation, right ureteral stent placement  Surgeon: Nickolas Madrid, MD  Anesthesia: General  Complications: None  Intraoperative findings:  1. Very high bladder neck, ureter remained tight 2. All right-sided stones dusted, stent placed with Dangler  EBL: Minimal  Specimens: None  Drains: Right 6 French by 26 cm ureteral stent with Dangler  Indication: Roger Chang. is a 60 y.o. patient with 5 mm right proximal ureteral stone who previously underwent stent placement and bladder biopsy. Biopsy showed only inflammation.  After reviewing the management options for treatment, they elected to proceed with the above surgical procedure(s). We have discussed the potential benefits and risks of the procedure, side effects of the proposed treatment, the likelihood of the patient achieving the goals of the procedure, and any potential problems that might occur during the procedure or recuperation. Informed consent has been obtained.  Description of procedure:  The patient was taken to the operating room and general anesthesia was induced. SCDs were placed for DVT prophylaxis. The patient was placed in the dorsal lithotomy position, prepped and draped in the usual sterile fashion, and preoperative antibiotics(Ancef) were administered. A preoperative time-out was performed.   The 21 French rigid cystoscope was used to intubate the urethra and this was followed proximally to the bladder. The prostate was moderate in size with a very high bladder neck which limited cystoscopy. There were no suspicious lesions. The stent emanating from the right ureteral orifice was grasped and pulled to the meatus, and a sensor wire was advanced up into the kidney  and the old stent removed.  I attempted to pass a dual-lumen ureteral access catheter over the wire to add a second safety wire, but again met resistance in the mid ureter.  The single channel digital ureteroscope was advanced over the wire and passed easily up to the kidney. Under direct vision there was a 5 mm yellow stone in the renal pelvis that I suspect had been pushed back up during prior stent placement, as well as a 5 mm midpole stone. These were both dusted with a 242 um laser fiber on settings of 0.5 J and 40 Hz. Thorough pyeloscopy revealed no other stones. Careful pullback ureteroscopy demonstrated no ureteral injury or ureteral fragments. A retrograde pyelogram was performed by injecting contrast through the scope at the mid ureter and showed an air bubble in the proximal ureter, but no other extravasation or abnormalities.  In the distal ureter, a sensor wire was passed through the scope up to the kidney under fluoroscopic vision. A rigid cystoscope was backloaded over the wire, and a 6 [place] by 26 cm ureteral stent was uneventfully placed with an excellent curl in the upper pole, as well as under direct vision in the bladder. The bladder was drained and the scope was removed. The Dangler was secured to the penis using Mastisol and Tegaderm.  Disposition: Stable to PACU  Plan: -Remove stent at home on Wednesday morning -Follow-up in clinic for renal ultrasound in 4 weeks -Would lean toward SWL in the future for any future stone episodes secondary to his high bladder neck, ureter very close to the bladder neck, and tight ureter even despite prior stent placement  Nickolas Madrid, MD

## 2021-02-15 NOTE — Anesthesia Preprocedure Evaluation (Signed)
Anesthesia Evaluation  Patient identified by MRN, date of birth, ID band Patient awake    Reviewed: Allergy & Precautions, NPO status , Patient's Chart, lab work & pertinent test results  History of Anesthesia Complications Negative for: history of anesthetic complications  Airway Mallampati: III  TM Distance: >3 FB Neck ROM: Full    Dental no notable dental hx.    Pulmonary sleep apnea and Continuous Positive Airway Pressure Ventilation , neg COPD,    breath sounds clear to auscultation- rhonchi (-) wheezing      Cardiovascular Exercise Tolerance: Good hypertension, Pt. on medications (-) CAD, (-) Past MI, (-) Cardiac Stents and (-) CABG  Rhythm:Regular Rate:Normal - Systolic murmurs and - Diastolic murmurs    Neuro/Psych neg Seizures negative neurological ROS  negative psych ROS   GI/Hepatic negative GI ROS, Neg liver ROS,   Endo/Other  negative endocrine ROSneg diabetes  Renal/GU Renal disease: nephrolithiasis.     Musculoskeletal negative musculoskeletal ROS (+)   Abdominal (+) + obese,   Peds  Hematology negative hematology ROS (+)   Anesthesia Other Findings Past Medical History: No date: Dyspnea No date: History of kidney stones No date: Hypertension     Comment:  Unspecified 03/2016: Microscopic hematuria No date: Sleep apnea     Comment:  CPAP   Reproductive/Obstetrics                             Anesthesia Physical Anesthesia Plan  ASA: II  Anesthesia Plan: General   Post-op Pain Management:    Induction: Intravenous  PONV Risk Score and Plan: 1 and Ondansetron and Midazolam  Airway Management Planned: Oral ETT  Additional Equipment:   Intra-op Plan:   Post-operative Plan: Extubation in OR  Informed Consent: I have reviewed the patients History and Physical, chart, labs and discussed the procedure including the risks, benefits and alternatives for the  proposed anesthesia with the patient or authorized representative who has indicated his/her understanding and acceptance.     Dental advisory given  Plan Discussed with: CRNA and Anesthesiologist  Anesthesia Plan Comments:         Anesthesia Quick Evaluation

## 2021-02-15 NOTE — Anesthesia Postprocedure Evaluation (Signed)
Anesthesia Post Note  Patient: Roger Chang.  Procedure(s) Performed: CYSTOSCOPY/URETEROSCOPY/HOLMIUM LASER/STENT PLACEMENT (Right )  Patient location during evaluation: PACU Anesthesia Type: General Level of consciousness: awake and alert and oriented Pain management: pain level controlled Vital Signs Assessment: post-procedure vital signs reviewed and stable Respiratory status: spontaneous breathing, nonlabored ventilation and respiratory function stable Cardiovascular status: blood pressure returned to baseline and stable Postop Assessment: no signs of nausea or vomiting Anesthetic complications: no   No complications documented.   Last Vitals:  Vitals:   02/15/21 1105 02/15/21 1130  BP: 127/87 133/86  Pulse: 71 60  Resp:  16  Temp: (!) 35.9 C 36.6 C  SpO2: 96% 96%    Last Pain:  Vitals:   02/15/21 1130  PainSc: 0-No pain                 Amy Penwarden

## 2021-02-15 NOTE — Transfer of Care (Signed)
Immediate Anesthesia Transfer of Care Note  Patient: Roger Chang.  Procedure(s) Performed: CYSTOSCOPY/URETEROSCOPY/HOLMIUM LASER/STENT PLACEMENT (Right )  Patient Location: PACU  Anesthesia Type:General  Level of Consciousness: awake, drowsy and patient cooperative  Airway & Oxygen Therapy: Patient Spontanous Breathing and Patient connected to face mask oxygen  Post-op Assessment: Report given to RN, Post -op Vital signs reviewed and stable and Patient moving all extremities  Post vital signs: Reviewed and stable  Last Vitals:  Vitals Value Taken Time  BP 127/87 02/15/21 1105  Temp    Pulse 63 02/15/21 1116  Resp 17 02/15/21 1116  SpO2 88 % 02/15/21 1116  Vitals shown include unvalidated device data.  Last Pain:  Vitals:   02/15/21 0913  PainSc: 0-No pain         Complications: No complications documented.

## 2021-02-15 NOTE — Anesthesia Procedure Notes (Signed)
Procedure Name: Intubation Date/Time: 02/15/2021 10:21 AM Performed by: Henrietta Hoover, CRNA Pre-anesthesia Checklist: Patient identified, Emergency Drugs available, Suction available and Patient being monitored Patient Re-evaluated:Patient Re-evaluated prior to induction Oxygen Delivery Method: Circle system utilized Preoxygenation: Pre-oxygenation with 100% oxygen Induction Type: IV induction Ventilation: Mask ventilation without difficulty Laryngoscope Size: McGraph and 4 Grade View: Grade II Tube type: Oral Tube size: 7.5 mm Number of attempts: 1 Airway Equipment and Method: Stylet and Video-laryngoscopy Placement Confirmation: ETT inserted through vocal cords under direct vision,  positive ETCO2 and breath sounds checked- equal and bilateral Secured at: 21 cm Tube secured with: Tape Dental Injury: Teeth and Oropharynx as per pre-operative assessment  Difficulty Due To: Difficulty was anticipated and Difficult Airway- due to anterior larynx

## 2021-02-15 NOTE — Discharge Instructions (Signed)

## 2021-02-15 NOTE — Addendum Note (Signed)
Addendum  created 02/15/21 1213 by Henrietta Hoover, CRNA   Intraprocedure Event edited

## 2021-02-23 ENCOUNTER — Other Ambulatory Visit: Payer: Self-pay | Admitting: Urology

## 2021-03-04 ENCOUNTER — Encounter: Payer: Self-pay | Admitting: Family Medicine

## 2021-03-06 ENCOUNTER — Other Ambulatory Visit: Payer: Self-pay | Admitting: Urology

## 2021-03-11 ENCOUNTER — Other Ambulatory Visit: Payer: Self-pay | Admitting: *Deleted

## 2021-03-11 MED ORDER — PREDNISONE 20 MG PO TABS: ORAL_TABLET | ORAL | 0 refills | Status: DC

## 2021-03-14 DIAGNOSIS — Z20822 Contact with and (suspected) exposure to covid-19: Secondary | ICD-10-CM | POA: Diagnosis not present

## 2021-03-25 ENCOUNTER — Ambulatory Visit
Admission: RE | Admit: 2021-03-25 | Discharge: 2021-03-25 | Disposition: A | Discharge status: Home / Self Care | Payer: BC Managed Care – PPO | Source: Ambulatory Visit | Attending: Urology | Admitting: Urology

## 2021-03-25 ENCOUNTER — Other Ambulatory Visit: Payer: Self-pay

## 2021-03-25 DIAGNOSIS — N201 Calculus of ureter: Secondary | ICD-10-CM | POA: Insufficient documentation

## 2021-03-25 DIAGNOSIS — N200 Calculus of kidney: Secondary | ICD-10-CM | POA: Diagnosis not present

## 2021-03-27 ENCOUNTER — Ambulatory Visit (INDEPENDENT_AMBULATORY_CARE_PROVIDER_SITE_OTHER): Payer: BC Managed Care – PPO | Admitting: Urology

## 2021-03-27 ENCOUNTER — Other Ambulatory Visit: Payer: Self-pay

## 2021-03-27 ENCOUNTER — Encounter: Payer: Self-pay | Admitting: Urology

## 2021-03-27 VITALS — BP 144/77 | HR 73 | Ht 68.0 in | Wt 200.0 lb

## 2021-03-27 DIAGNOSIS — Z87442 Personal history of urinary calculi: Secondary | ICD-10-CM

## 2021-03-27 DIAGNOSIS — N200 Calculus of kidney: Secondary | ICD-10-CM

## 2021-03-27 NOTE — Progress Notes (Signed)
   03/27/2021 8:57 AM   Virginia Crews. January 16, 1961 [id number]  Reason for visit: Follow up right ureteroscopy, history of BPH and ED  HPI: I saw Mr. Pickrell back in urology clinic for nephrolithiasis.  He also has a history of BPH and ED, but is not currently on any medications and denies any problems with erections or urination at this time.  He presented with a 5 mm right mid ureteral stone and poorly controlled pain on 01/31/2021 and underwent attempted ureteroscopy on 02/01/2021.  The ureter was extremely tight and required stent placement with delayed management.  He underwent definitive ureteroscopy on 02/15/2021 which was notable for a persistently narrowed right ureter.  All right-sided stones were treated at that time, and his stone was removed on a Dangler.  He denies any problems since surgery and is doing well.  He denies any flank pain, gross hematuria, or urinary symptoms.  I personally viewed and interpreted his follow-up renal ultrasound on 01/31/2021 that shows no hydronephrosis.  We discussed general stone prevention strategies including adequate hydration with goal of producing 2.5 L of urine daily, increasing citric acid intake, increasing calcium intake during high oxalate meals, minimizing animal protein, and decreasing salt intake. Information about dietary recommendations given today.   RTC 1 year KUB prior Would strongly consider SWL for any future stone events based on his challenging anatomy   Sondra Come, MD  [facility] [address] [place], [place] [address] [phone]

## 2021-03-27 NOTE — Patient Instructions (Signed)
Dietary Guidelines to Help Prevent Kidney Stones Kidney stones are deposits of minerals and salts that form inside your kidneys. Your risk of developing kidney stones may be greater depending on your diet, your lifestyle, the medicines you take, and whether you have certain medical conditions. Most people can lower their chances of developing kidney stones by following the instructions below. Your dietitian may give you more specific instructions depending on your overall health and the type of kidney stones you tend to develop. What are tips for following this plan? Reading food labels  Choose foods with "no salt added" or "low-salt" labels. Limit your salt (sodium) intake to less than 1,500 mg a day.  Choose foods with calcium for each meal and snack. Try to eat about 300 mg of calcium at each meal. Foods that contain 200-500 mg of calcium a serving include: ? 8 oz (237 mL) of milk, calcium-fortifiednon-dairy milk, and calcium-fortifiedfruit juice. Calcium-fortified means that calcium has been added to these drinks. ? 8 oz (237 mL) of kefir, yogurt, and soy yogurt. ? 4 oz (114 g) of tofu. ? 1 oz (28 g) of cheese. ? 1 cup (150 g) of dried figs. ? 1 cup (91 g) of cooked broccoli. ? One 3 oz (85 g) can of sardines or mackerel. Most people need 1,000-1,500 mg of calcium a day. Talk to your dietitian about how much calcium is recommended for you.   Shopping  Buy plenty of fresh fruits and vegetables. Most people do not need to avoid fruits and vegetables, even if these foods contain nutrients that may contribute to kidney stones.  When shopping for convenience foods, choose: ? Whole pieces of fruit. ? Pre-made salads with dressing on the side. ? Low-fat fruit and yogurt smoothies.  Avoid buying frozen meals or prepared deli foods. These can be high in sodium.  Look for foods with live cultures, such as yogurt and kefir.  Choose high-fiber grains, such as whole-wheat breads, oat bran, and  wheat cereals. Cooking  Do not add salt to food when cooking. Place a salt shaker on the table and allow each person to add his or her own salt to taste.  Use vegetable protein, such as beans, textured vegetable protein (TVP), or tofu, instead of meat in pasta, casseroles, and soups. Meal planning  Eat less salt, if told by your dietitian. To do this: ? Avoid eating processed or pre-made food. ? Avoid eating fast food.  Eat less animal protein, including cheese, meat, poultry, or fish, if told by your dietitian. To do this: ? Limit the number of times you have meat, poultry, fish, or cheese each week. Eat a diet free of meat at least 2 days a week. ? Eat only one serving each day of meat, poultry, fish, or seafood. ? When you prepare animal protein, cut pieces into small portion sizes. For most meat and fish, one serving is about the size of the palm of your hand.  Eat at least five servings of fresh fruits and vegetables each day. To do this: ? Keep fruits and vegetables on hand for snacks. ? Eat one piece of fruit or a handful of berries with breakfast. ? Have a salad and fruit at lunch. ? Have two kinds of vegetables at dinner.  Limit foods that are high in a substance called oxalate. These include: ? Spinach (cooked), rhubarb, beets, sweet potatoes, and Swiss chard. ? Peanuts. ? Potato chips, french fries, and baked potatoes with skin on. ? Nuts and   nut products. ? Chocolate.  If you regularly take a diuretic medicine, make sure to eat at least 1 or 2 servings of fruits or vegetables that are high in potassium each day. These include: ? Avocado. ? Banana. ? Orange, prune, carrot, or tomato juice. ? Baked potato. ? Cabbage. ? Beans and split peas. Lifestyle  Drink enough fluid to keep your urine pale yellow. This is the most important thing you can do. Spread your fluid intake throughout the day.  If you drink alcohol: ? Limit how much you use to:  0-1 drink a day for  women who are not pregnant.  0-2 drinks a day for men. ? Be aware of how much alcohol is in your drink. In the [place] one drink equals one 12 oz bottle of beer (355 mL), one 5 oz glass of wine (148 mL), or one 1 oz glass of hard liquor (44 mL).  Lose weight if told by your health care provider. Work with your dietitian to find an eating plan and weight loss strategies that work best for you.   General information  Talk to your health care provider and dietitian about taking daily supplements. You may be told the following depending on your health and the cause of your kidney stones: ? Not to take supplements with vitamin C. ? To take a calcium supplement. ? To take a daily probiotic supplement. ? To take other supplements such as magnesium, fish oil, or vitamin B6.  Take over-the-counter and prescription medicines only as told by your health care provider. These include supplements. What foods should I limit? Limit your intake of the following foods, or eat them as told by your dietitian. Vegetables Spinach. Rhubarb. Beets. Canned vegetables. Pickles. Olives. Baked potatoes with skin. Grains Wheat bran. Baked goods. Salted crackers. Cereals high in sugar. Meats and other proteins Nuts. Nut butters. Large portions of meat, poultry, or fish. Salted, precooked, or cured meats, such as sausages, meat loaves, and hot dogs. Dairy Cheese. Beverages Regular soft drinks. Regular vegetable juice. Seasonings and condiments Seasoning blends with salt. Salad dressings. Soy sauce. Ketchup. Barbecue sauce. Other foods Canned soups. Canned pasta sauce. Casseroles. Pizza. Lasagna. Frozen meals. Potato chips. French fries. The items listed above may not be a complete list of foods and beverages you should limit. Contact a dietitian for more information. What foods should I avoid? Talk to your dietitian about specific foods you should avoid based on the type of kidney stones you have and your overall  health. Fruits Grapefruit. The item listed above may not be a complete list of foods and beverages you should avoid. Contact a dietitian for more information. Summary  Kidney stones are deposits of minerals and salts that form inside your kidneys.  You can lower your risk of kidney stones by making changes to your diet.  The most important thing you can do is drink enough fluid. Drink enough fluid to keep your urine pale yellow.  Talk to your dietitian about how much calcium you should have each day, and eat less salt and animal protein as told by your dietitian. This information is not intended to replace advice given to you by your health care provider. Make sure you discuss any questions you have with your health care provider. Document Revised: 12/01/2019 Document Reviewed: 12/01/2019 Elsevier Patient Education  2021 [employer]  

## 2021-04-16 NOTE — Progress Notes (Signed)
I,[name],acting as a scribe for Megan Mans, MD.,have documented all relevant documentation on the behalf of Megan Mans, MD,as directed by  Megan Mans, MD while in the presence of Megan Mans, MD.  Established patient visit   Patient: Roger Chang.   DOB: [birth year]   60 y.o. Male  MRN: [id number] Visit Date: 04/17/2021  Today's healthcare provider: Megan Mans, MD   Chief Complaint  Patient presents with  . Follow-up  . Hypertension  . Prediabetes   Subjective    HPI  Patient comes in today for follow-up.  He is doing well with his kidney symptoms recently.  He has seen urology.  He is taking tart cherry juice to try to prevent a recurrence from a single episode of gout.  We discussed checking a uric acid.  Overall he feels well.  His A1c today is 5.9. He is dating some still mourning the death of his wife.  He continues to work for [employer]. Hypertension, follow-up  BP Readings from Last 3 Encounters:  04/17/21 128/81  03/27/21 (!) 144/77  02/15/21 132/86   Wt Readings from Last 3 Encounters:  04/17/21 211 lb (95.7 kg)  03/27/21 200 lb (90.7 kg)  02/12/21 200 lb (90.7 kg)     He was last seen for hypertension 6 months ago.  BP at that visit was 114/78. Management since that visit includes; on Cardizem CD. He reports good compliance with treatment. He is not having side effects. none He is exercising. He is adherent to low salt diet.   Outside blood pressures are checks occasionally.  He does not smoke.  Use of agents associated with hypertension: none.   --------------------------------------------------------------------  Prediabetes, Follow-up  Lab Results  Component Value Date   HGBA1C 5.9 (A) 04/17/2021   HGBA1C 6.0 (H) 10/09/2020   HGBA1C 5.8 (H) 03/04/2018   GLUCOSE 90 02/13/2021   GLUCOSE 100 (H) 10/09/2020   GLUCOSE 97 03/08/2020    Last seen for for this6 months ago.  Management since that visit  includes; Diet and exercise stressed. Current symptoms include none and have been unchanged.  Prior visit with dietician: no Current diet: well balanced Current exercise: walking and golfing  Pertinent Labs:    Component Value Date/Time   CHOL 224 (H) 10/09/2020 1012   TRIG 196 (H) 10/09/2020 1012   CHOLHDL 5.1 (H) 10/09/2020 1012   CREATININE 1.72 (H) 02/13/2021 0952    Wt Readings from Last 3 Encounters:  04/17/21 211 lb (95.7 kg)  03/27/21 200 lb (90.7 kg)  02/12/21 200 lb (90.7 kg)    --------------------------------------------------------------------       Medications: Outpatient Medications Prior to Visit  Medication Sig  . cholecalciferol (VITAMIN D) 1000 UNITS tablet Take 1,000 Units by mouth daily.  Marland Kitchen diltiazem (CARDIZEM CD) 180 MG 24 hr capsule TAKE 1 CAPSULE BY MOUTH EVERY DAY (Patient taking differently: Take 180 mg by mouth daily. TAKE 1 CAPSULE BY MOUTH EVERY DAY)  . eszopiclone (LUNESTA) 2 MG TABS tablet TAKE 1 TABLET BY MOUTH EVERYDAY AT BEDTIME (Patient taking differently: Take 2 mg by mouth at bedtime as needed for sleep.)  . labetalol (NORMODYNE) 200 MG tablet Take 1 tablet (200 mg total) by mouth daily.  . pravastatin (PRAVACHOL) 40 MG tablet Take 1 tablet (40 mg total) by mouth daily.  Marland Kitchen HYDROcodone-acetaminophen (NORCO/VICODIN) 5-325 MG tablet Take 1 tablet by mouth every 6 (six) hours as needed for moderate pain.  Marland Kitchen  HYDROcodone-acetaminophen (NORCO/VICODIN) 5-325 MG tablet Take 1 tablet by mouth every 6 (six) hours as needed for severe pain.  Marland Kitchen ondansetron (ZOFRAN) 4 MG tablet Take 1 tablet (4 mg total) by mouth every 8 (eight) hours as needed for nausea or vomiting.  Marland Kitchen oxybutynin (DITROPAN-XL) 10 MG 24 hr tablet TAKE 1 TAB BY MOUTH DAILY AS NEEDED (BLADDER SPASM/BLADDER PAIN, URGENCY/FREQUENCY OF URINATION).  Marland Kitchen predniSONE (DELTASONE) 20 MG tablet Take 6 tablets Day 1, 5 Tablets Day 2, 4 Tablets Day 3, 3 Tablets Day 4, 2 tablets Day 5, 1 Tablet Day 6  .  tamsulosin (FLOMAX) 0.4 MG CAPS capsule Take 1 capsule (0.4 mg total) by mouth daily.   No facility-administered medications prior to visit.    Review of Systems  Constitutional: Negative for appetite change, chills and fever.  Respiratory: Negative for chest tightness, shortness of breath and wheezing.   Cardiovascular: Negative for chest pain and palpitations.  Gastrointestinal: Negative for abdominal pain, nausea and vomiting.        Objective    BP 128/81 (BP Location: Left Arm, Patient Position: Sitting, Cuff Size: Large)   Pulse 64   Temp 97.8 F (36.6 C) (Oral)   Resp 16   Ht 5\' 8"  (1.727 m)   Wt 211 lb (95.7 kg)   SpO2 99%   BMI 32.08 kg/m  BP Readings from Last 3 Encounters:  04/17/21 128/81  03/27/21 (!) 144/77  02/15/21 132/86   Wt Readings from Last 3 Encounters:  04/17/21 211 lb (95.7 kg)  03/27/21 200 lb (90.7 kg)  02/12/21 200 lb (90.7 kg)       Physical Exam Vitals reviewed.  Constitutional:      Appearance: He is well-developed.  HENT:     Head: Normocephalic and atraumatic.     Right Ear: External ear normal.     Left Ear: External ear normal.     Nose: Nose normal.  Eyes:     Conjunctiva/sclera: Conjunctivae normal.     Pupils: Pupils are equal, round, and reactive to light.  Cardiovascular:     Rate and Rhythm: Normal rate and regular rhythm.     Heart sounds: Normal heart sounds.  Pulmonary:     Effort: Pulmonary effort is normal.     Breath sounds: Normal breath sounds.  Abdominal:     General: Bowel sounds are normal.     Palpations: Abdomen is soft.  Genitourinary:    Penis: Normal.      Testes: Normal.  Musculoskeletal:        General: Normal range of motion.     Cervical back: Normal range of motion and neck supple.  Skin:    General: Skin is warm and dry.  Neurological:     General: No focal deficit present.     Mental Status: He is alert and oriented to person, place, and time.  Psychiatric:        Mood and Affect: Mood  normal.        Behavior: Behavior normal.        Thought Content: Thought content normal.        Judgment: Judgment normal.       Results for orders placed or performed in visit on 04/17/21  POCT glycosylated hemoglobin (Hb A1C)  Result Value Ref Range   Hemoglobin A1C 5.9 (A) 4.0 - 5.6 %   Est. average glucose Bld gHb Est-mCnc 123     Assessment & Plan     1. Elevated hemoglobin  A1c  - POCT glycosylated hemoglobin (Hb A1C)--5.9 today.  2. Essential hypertension   3. Acute gout of left foot, unspecified cause Check uric acid.  If he has recurrence will consider treating with allopurinol.  Use cherry juice for now good control on labetalol - Uric acid  4. Pure hypercholesterolemia Pravastatin.  5. Insomnia, unspecified type He uses Lunesta as needed.  We will talk to him on the next visit about trying to do without.  6. Kidney stones Recent work-up by urology  7. Chronic kidney disease, unspecified CKD stage Follow routine labs on next visit.  Stay hydrated as also directed by urology.   Return in about 6 months (around 10/17/2021).         Adith Tejada Wendelyn Breslow, MD  [facility] [phone] (phone) [phone] (fax)  [facility]

## 2021-04-17 ENCOUNTER — Ambulatory Visit (INDEPENDENT_AMBULATORY_CARE_PROVIDER_SITE_OTHER): Payer: BC Managed Care – PPO | Admitting: Family Medicine

## 2021-04-17 ENCOUNTER — Encounter: Payer: Self-pay | Admitting: Family Medicine

## 2021-04-17 ENCOUNTER — Other Ambulatory Visit: Payer: Self-pay

## 2021-04-17 VITALS — BP 128/81 | HR 64 | Temp 97.8°F | Resp 16 | Ht 68.0 in | Wt 211.0 lb

## 2021-04-17 DIAGNOSIS — I10 Essential (primary) hypertension: Secondary | ICD-10-CM

## 2021-04-17 DIAGNOSIS — G4700 Insomnia, unspecified: Secondary | ICD-10-CM

## 2021-04-17 DIAGNOSIS — E7800 Pure hypercholesterolemia, unspecified: Secondary | ICD-10-CM | POA: Diagnosis not present

## 2021-04-17 DIAGNOSIS — M109 Gout, unspecified: Secondary | ICD-10-CM

## 2021-04-17 DIAGNOSIS — R7309 Other abnormal glucose: Secondary | ICD-10-CM | POA: Diagnosis not present

## 2021-04-17 DIAGNOSIS — N200 Calculus of kidney: Secondary | ICD-10-CM

## 2021-04-17 DIAGNOSIS — N189 Chronic kidney disease, unspecified: Secondary | ICD-10-CM

## 2021-04-17 LAB — POCT GLYCOSYLATED HEMOGLOBIN (HGB A1C)
Est. average glucose Bld gHb Est-mCnc: 123
Hemoglobin A1C: 5.9 % — AB (ref 4.0–5.6)

## 2021-04-18 LAB — URIC ACID: Uric Acid: 9.1 mg/dL — ABNORMAL HIGH (ref 3.8–8.4)

## 2021-04-25 ENCOUNTER — Other Ambulatory Visit: Payer: Self-pay | Admitting: *Deleted

## 2021-04-25 DIAGNOSIS — M109 Gout, unspecified: Secondary | ICD-10-CM

## 2021-04-25 MED ORDER — ALLOPURINOL 100 MG PO TABS: 100.0000 mg | ORAL_TABLET | Freq: Every day | ORAL | 5 refills | Status: DC

## 2021-04-25 NOTE — Progress Notes (Signed)
Rx for allopurinol 100 mg qd sent to pharmacy.

## 2021-06-25 ENCOUNTER — Telehealth: Payer: Self-pay

## 2021-06-25 DIAGNOSIS — M109 Gout, unspecified: Secondary | ICD-10-CM

## 2021-06-25 MED ORDER — ALLOPURINOL 100 MG PO TABS
100.0000 mg | ORAL_TABLET | Freq: Every day | ORAL | 3 refills | Status: DC
Start: 2021-06-25 — End: 2022-06-16

## 2021-06-25 NOTE — Telephone Encounter (Signed)
Copied from CRM [id number]. Topic: General - Other >> Jun 25, 2021  8:59 AM Jaquita Rector A wrote: Reason for CRM: Patient called in to saythat his insurance require that he have 90 day Rx for medication need a new Rx sent for allopurinol (ZYLOPRIM) 100 MG tablet to the [facility] at [facility], [place] - [address] AT [address]  Phone:  [phone] Fax:  [phone]

## 2021-06-25 NOTE — Telephone Encounter (Signed)
Medication sent in. 

## 2021-07-10 ENCOUNTER — Other Ambulatory Visit: Payer: Self-pay | Admitting: Family Medicine

## 2021-07-10 DIAGNOSIS — G4700 Insomnia, unspecified: Secondary | ICD-10-CM

## 2021-07-10 NOTE — Telephone Encounter (Signed)
Please review. Ok to refill?  

## 2021-10-14 ENCOUNTER — Ambulatory Visit (INDEPENDENT_AMBULATORY_CARE_PROVIDER_SITE_OTHER): Payer: BC Managed Care – PPO | Admitting: Family Medicine

## 2021-10-14 ENCOUNTER — Other Ambulatory Visit: Payer: Self-pay

## 2021-10-14 ENCOUNTER — Ambulatory Visit: Payer: Self-pay | Admitting: Urology

## 2021-10-14 VITALS — BP 121/90 | HR 73 | Temp 98.6°F | Wt 209.0 lb

## 2021-10-14 DIAGNOSIS — M109 Gout, unspecified: Secondary | ICD-10-CM

## 2021-10-14 DIAGNOSIS — N189 Chronic kidney disease, unspecified: Secondary | ICD-10-CM

## 2021-10-14 DIAGNOSIS — Z125 Encounter for screening for malignant neoplasm of prostate: Secondary | ICD-10-CM | POA: Diagnosis not present

## 2021-10-14 DIAGNOSIS — N200 Calculus of kidney: Secondary | ICD-10-CM

## 2021-10-14 DIAGNOSIS — E782 Mixed hyperlipidemia: Secondary | ICD-10-CM | POA: Diagnosis not present

## 2021-10-14 DIAGNOSIS — Z23 Encounter for immunization: Secondary | ICD-10-CM | POA: Diagnosis not present

## 2021-10-14 DIAGNOSIS — Z0000 Encounter for general adult medical examination without abnormal findings: Secondary | ICD-10-CM

## 2021-10-14 DIAGNOSIS — I10 Essential (primary) hypertension: Secondary | ICD-10-CM

## 2021-10-14 DIAGNOSIS — G4733 Obstructive sleep apnea (adult) (pediatric): Secondary | ICD-10-CM

## 2021-10-14 LAB — POCT URINALYSIS DIPSTICK
Bilirubin, UA: NEGATIVE
Blood, UA: NEGATIVE
Glucose, UA: NEGATIVE
Ketones, UA: NEGATIVE
Leukocytes, UA: NEGATIVE
Nitrite, UA: NEGATIVE
Protein, UA: NEGATIVE
Spec Grav, UA: 1.02 (ref 1.010–1.025)
Urobilinogen, UA: 0.2 E.U./dL
pH, UA: 5 (ref 5.0–8.0)

## 2021-10-14 NOTE — Progress Notes (Signed)
Complete physical exam   Patient: Roger Chang.   DOB: 1961-04-17   60 y.o. Male  MRN: [id number] Visit Date: 10/14/2021  Today's healthcare provider: Megan Mans, MD   No chief complaint on file.  Subjective    Roger Chang. is a 60 y.o. male who presents today for a complete physical exam.  He reports consuming a general diet. Gym/ health club routine includes walking on track . He generally feels well. He reports sleeping fairly well. He does have additional problems to discuss today.  He is a widower who has 2 sons.  He has been working for 46 years with [employer] and plans to continue to work.  Overall he feels well. HPI  Patient reports having pain in bilateral knees.  He would like to have it checked and possibly get a referral to ortho.  Past Medical History:  Diagnosis Date   Dyspnea    History of kidney stones    Hypertension    Unspecified   Microscopic hematuria 03/2016   Sleep apnea    CPAP   Past Surgical History:  Procedure Laterality Date   CHOLECYSTECTOMY  2012   COLONOSCOPY WITH PROPOFOL N/A 10/18/2019   Procedure: COLONOSCOPY WITH PROPOFOL;  Surgeon: Pasty Spillers, MD;  Location[facility];  Service: Endoscopy;  Laterality: N/A;  sleep apnea   CYSTOSCOPY/URETEROSCOPY/HOLMIUM LASER/STENT PLACEMENT Right 02/01/2021   Procedure: CYSTOSCOPY/URETEROSCOPY/HOLMIUM LASER/STENT PLACEMENT;  Surgeon: Sondra Come, MD;  Location: [facility];  Service: Urology;  Laterality: Right;   CYSTOSCOPY/URETEROSCOPY/HOLMIUM LASER/STENT PLACEMENT Right 02/15/2021   Procedure: CYSTOSCOPY/URETEROSCOPY/HOLMIUM LASER/STENT PLACEMENT;  Surgeon: Sondra Come, MD;  Location: [facility];  Service: Urology;  Laterality: Right;   EXTRACORPOREAL SHOCK WAVE LITHOTRIPSY Left 04/10/2016   Procedure: EXTRACORPOREAL SHOCK WAVE LITHOTRIPSY (ESWL);  Surgeon: Hildred Laser, MD;  Location: [facility];  Service: Urology;  Laterality: Left;   EYE SURGERY     FULGURATION OF  BLADDER TUMOR  02/01/2021   Procedure: FULGURATION OF BLADDER TUMOR WITH BIOPSY;  Surgeon: Sondra Come, MD;  Location: [facility];  Service: Urology;;   HEMORRHOID SURGERY  2000   LASIK     VASECTOMY  1999   Social History   Socioeconomic History   Marital status: Widowed    Spouse name: Not on file   Number of children: Not on file   Years of education: Not on file   Highest education level: Not on file  Occupational History   Occupation: Full time  Tobacco Use   Smoking status: Never   Smokeless tobacco: Never  Vaping Use   Vaping Use: Never used  Substance and Sexual Activity   Alcohol use: Yes    Alcohol/week: 6.0 standard drinks    Types: 6 Glasses of wine per week    Comment: occasionally   Drug use: No   Sexual activity: Yes    Birth control/protection: None  Other Topics Concern   Not on file  Social History Narrative   Pt gets regular exercise.   Social Determinants of Health   Financial Resource Strain: Not on file  Food Insecurity: Not on file  Transportation Needs: Not on file  Physical Activity: Not on file  Stress: Not on file  Social Connections: Not on file  Intimate Partner Violence: Not on file   Family Status  Relation Name Status   Mother  Deceased at age 75   Father  Deceased at age 58   Sister  Deceased   Brother  Alive   Sister  Alive   Sister  Alive   Sister  Alive   Brother  Alive   Son  Alive   Son  Alive   Other  (Not Specified)   Neg Hx  (Not Specified)   Family History  Problem Relation Age of Onset   Breast cancer Mother    Heart attack Father    Lung cancer Father    Cancer Other    Kidney disease Neg Hx    Prostate cancer Neg Hx    Kidney cancer Neg Hx    Bladder Cancer Neg Hx    Allergies  Allergen Reactions   Lisinopril Swelling    Swelling of the lips and dry mouth   Amlodipine Swelling    Swelling in extremeties more than mild, pt was not comfortable with that   Hydrochlorothiazide Swelling    Swelling  of the foot, possibly contributed to developing gout, felt dehydrated, B/P not controlled   Ambien [Zolpidem]     Very groggy and confused the morning after   Losartan Other (See Comments)    Weird dreams, ED, swelling    Patient Care Team: Maple Hudson., MD as PCP - General (Family Medicine)   Medications: Outpatient Medications Prior to Visit  Medication Sig   allopurinol (ZYLOPRIM) 100 MG tablet Take 1 tablet (100 mg total) by mouth daily.   cholecalciferol (VITAMIN D) 1000 UNITS tablet Take 1,000 Units by mouth daily.   diltiazem (CARDIZEM CD) 180 MG 24 hr capsule TAKE 1 CAPSULE BY MOUTH EVERY DAY (Patient taking differently: Take 180 mg by mouth daily. TAKE 1 CAPSULE BY MOUTH EVERY DAY)   eszopiclone (LUNESTA) 2 MG TABS tablet Take 1 tablet (2 mg total) by mouth at bedtime as needed for sleep.   labetalol (NORMODYNE) 200 MG tablet Take 1 tablet (200 mg total) by mouth daily.   pravastatin (PRAVACHOL) 40 MG tablet Take 1 tablet (40 mg total) by mouth daily.   HYDROcodone-acetaminophen (NORCO/VICODIN) 5-325 MG tablet Take 1 tablet by mouth every 6 (six) hours as needed for moderate pain.   HYDROcodone-acetaminophen (NORCO/VICODIN) 5-325 MG tablet Take 1 tablet by mouth every 6 (six) hours as needed for severe pain.   ondansetron (ZOFRAN) 4 MG tablet Take 1 tablet (4 mg total) by mouth every 8 (eight) hours as needed for nausea or vomiting.   oxybutynin (DITROPAN-XL) 10 MG 24 hr tablet TAKE 1 TAB BY MOUTH DAILY AS NEEDED (BLADDER SPASM/BLADDER PAIN, URGENCY/FREQUENCY OF URINATION).   predniSONE (DELTASONE) 20 MG tablet Take 6 tablets Day 1, 5 Tablets Day 2, 4 Tablets Day 3, 3 Tablets Day 4, 2 tablets Day 5, 1 Tablet Day 6   tamsulosin (FLOMAX) 0.4 MG CAPS capsule Take 1 capsule (0.4 mg total) by mouth daily.   No facility-administered medications prior to visit.    Review of Systems  Constitutional: Negative.   HENT: Negative.    Eyes: Negative.   Respiratory: Negative.     Cardiovascular: Negative.   Gastrointestinal: Negative.   Endocrine: Negative.   Genitourinary: Negative.   Musculoskeletal:  Positive for arthralgias (knees).  Skin: Negative.   Allergic/Immunologic: Negative.   Neurological: Negative.   Hematological: Negative.   Psychiatric/Behavioral: Negative.        Objective    BP 121/90 (BP Location: Left Arm, Patient Position: Sitting, Cuff Size: Large)   Pulse 73   Temp 98.6 F (37 C) (Oral)   Wt 209 lb (94.8 kg)  SpO2 100%   BMI 31.78 kg/m  BP Readings from Last 3 Encounters:  10/14/21 121/90  04/17/21 128/81  03/27/21 (!) 144/77   Wt Readings from Last 3 Encounters:  10/14/21 209 lb (94.8 kg)  04/17/21 211 lb (95.7 kg)  03/27/21 200 lb (90.7 kg)      Physical Exam Constitutional:      Appearance: Normal appearance. He is normal weight.  HENT:     Head: Normocephalic and atraumatic.     Right Ear: Tympanic membrane, ear canal and external ear normal.     Left Ear: Tympanic membrane, ear canal and external ear normal.     Nose: Nose normal.     Mouth/Throat:     Mouth: Mucous membranes are moist.     Pharynx: Oropharynx is clear.  Eyes:     Extraocular Movements: Extraocular movements intact.     Conjunctiva/sclera: Conjunctivae normal.     Pupils: Pupils are equal, round, and reactive to light.  Cardiovascular:     Rate and Rhythm: Normal rate and regular rhythm.     Pulses: Normal pulses.     Heart sounds: Normal heart sounds.  Pulmonary:     Effort: Pulmonary effort is normal.     Breath sounds: Normal breath sounds.  Abdominal:     General: Abdomen is flat. Bowel sounds are normal.     Palpations: Abdomen is soft.  Musculoskeletal:        General: Normal range of motion.     Cervical back: Normal range of motion and neck supple.  Skin:    General: Skin is warm and dry.  Neurological:     General: No focal deficit present.     Mental Status: He is alert and oriented to person, place, and time. Mental  status is at baseline.  Psychiatric:        Mood and Affect: Mood normal.        Behavior: Behavior normal.        Thought Content: Thought content normal.        Judgment: Judgment normal.      Last depression screening scores PHQ 2/9 Scores 10/14/2021 04/17/2021 10/09/2020  PHQ - 2 Score 0 0 0  PHQ- 9 Score 1 1 -   Last fall risk screening Fall Risk  10/14/2021  Falls in the past year? 0  Number falls in past yr: 0  Injury with Fall? 0  Risk for fall due to : -  Follow up -   Last Audit-C alcohol use screening Alcohol Use Disorder Test (AUDIT) 10/14/2021  1. How often do you have a drink containing alcohol? 3  2. How many drinks containing alcohol do you have on a typical day when you are drinking? 0  3. How often do you have six or more drinks on one occasion? 0  AUDIT-C Score 3  4. How often during the last year have you found that you were not able to stop drinking once you had started? -  5. How often during the last year have you failed to do what was normally expected from you because of drinking? -  6. How often during the last year have you needed a first drink in the morning to get yourself going after a heavy drinking session? -  7. How often during the last year have you had a feeling of guilt of remorse after drinking? -  8. How often during the last year have you been unable to remember what  happened the night before because you had been drinking? -  9. Have you or someone else been injured as a result of your drinking? -  10. Has a relative or friend or a doctor or another health worker been concerned about your drinking or suggested you cut down? -  Alcohol Use Disorder Identification Test Final Score (AUDIT) -  Alcohol Brief Interventions/Follow-up -   A score of 3 or more in women, and 4 or more in men indicates increased risk for alcohol abuse, EXCEPT if all of the points are from question 1   No results found for any visits on 10/14/21.  Assessment & Plan     Routine Health Maintenance and Physical Exam  Exercise Activities and Dietary recommendations  Goals   None     Immunization History  Administered Date(s) Administered   Influenza,inj,Quad PF,6+ Mos 08/25/2017, 09/08/2018, 10/03/2019   Influenza-Unspecified 01/29/2017   Moderna Sars-Covid-2 Vaccination 03/07/2020, 04/04/2020   Td 10/09/2020   Tdap 08/13/2007    Health Maintenance  Topic Date Due   Zoster Vaccines- Shingrix (1 of 2) Never done   COVID-19 Vaccine (3 - Booster for Moderna series) 05/30/2020   INFLUENZA VACCINE  07/22/2021   COLONOSCOPY (Pts 45-73yrs Insurance coverage will need to be confirmed)  10/17/2029   TETANUS/TDAP  10/09/2030   Hepatitis C Screening  Completed   HIV Screening  Completed   Pneumococcal Vaccine 7-42 Years old  Aged Out   HPV VACCINES  Aged Out    Discussed health benefits of physical activity, and encouraged him to engage in regular exercise appropriate for his age and condition.  1. Annual physical exam  - CBC with Differential/Platelet - Comprehensive metabolic panel - Lipid Panel With LDL/HDL Ratio - TSH - POCT urinalysis dipstick  2. Prostate cancer screening  - PSA  3. Need for influenza vaccination   4. Gout, unspecified cause, unspecified chronicity, unspecified site On allopurinol - Uric acid  5. Kidney stones Followed closely by urology  6. Chronic kidney disease, unspecified CKD stage Avoid nonsteroidals and follow labs regularly  7. Essential hypertension   8. Obstructive sleep apnea syndrome Still uses mouthpiece for this with good success   No follow-ups on file.     I, Megan Mans, MD, have reviewed all documentation for this visit. The documentation on 10/19/21 for the exam, diagnosis, procedures, and orders are all accurate and complete.    Chabely Norby Wendelyn Breslow, MD  [facility] [phone] (phone) [phone] (fax)  [facility]

## 2021-10-15 LAB — COMPREHENSIVE METABOLIC PANEL
ALT: 22 IU/L (ref 0–44)
AST: 22 IU/L (ref 0–40)
Albumin/Globulin Ratio: 2.3 — ABNORMAL HIGH (ref 1.2–2.2)
Albumin: 5 g/dL — ABNORMAL HIGH (ref 3.8–4.8)
Alkaline Phosphatase: 35 IU/L — ABNORMAL LOW (ref 44–121)
BUN/Creatinine Ratio: 12 (ref 10–24)
BUN: 16 mg/dL (ref 8–27)
Bilirubin Total: 0.5 mg/dL (ref 0.0–1.2)
CO2: 22 mmol/L (ref 20–29)
Calcium: 10.3 mg/dL — ABNORMAL HIGH (ref 8.6–10.2)
Chloride: 101 mmol/L (ref 96–106)
Creatinine, Ser: 1.34 mg/dL — ABNORMAL HIGH (ref 0.76–1.27)
Globulin, Total: 2.2 g/dL (ref 1.5–4.5)
Glucose: 110 mg/dL — ABNORMAL HIGH (ref 70–99)
Potassium: 4.6 mmol/L (ref 3.5–5.2)
Sodium: 139 mmol/L (ref 134–144)
Total Protein: 7.2 g/dL (ref 6.0–8.5)
eGFR: 60 mL/min/{1.73_m2} (ref >59)

## 2021-10-15 LAB — PSA: Prostate Specific Ag, Serum: 1.2 ng/mL (ref 0.0–4.0)

## 2021-10-15 LAB — CBC WITH DIFFERENTIAL/PLATELET
Basophils Absolute: 0.1 10*3/uL (ref 0.0–0.2)
Basos: 2 %
EOS (ABSOLUTE): 0.1 10*3/uL (ref 0.0–0.4)
Eos: 3 %
Hematocrit: 47.2 % (ref 37.5–51.0)
Hemoglobin: 16.1 g/dL (ref 13.0–17.7)
Immature Grans (Abs): 0 10*3/uL (ref 0.0–0.1)
Immature Granulocytes: 0 %
Lymphocytes Absolute: 2.3 10*3/uL (ref 0.7–3.1)
Lymphs: 44 %
MCH: 30.6 pg (ref 26.6–33.0)
MCHC: 34.1 g/dL (ref 31.5–35.7)
MCV: 90 fL (ref 79–97)
Monocytes Absolute: 0.6 10*3/uL (ref 0.1–0.9)
Monocytes: 12 %
Neutrophils Absolute: 2 10*3/uL (ref 1.4–7.0)
Neutrophils: 39 %
Platelets: 286 10*3/uL (ref 150–450)
RBC: 5.27 x10E6/uL (ref 4.14–5.80)
RDW: 12.3 % (ref 11.6–15.4)
WBC: 5.2 10*3/uL (ref 3.4–10.8)

## 2021-10-15 LAB — TSH: TSH: 1.55 u[IU]/mL (ref 0.450–4.500)

## 2021-10-15 LAB — LIPID PANEL WITH LDL/HDL RATIO
Cholesterol, Total: 228 mg/dL — ABNORMAL HIGH (ref 100–199)
HDL: 49 mg/dL (ref >39)
LDL Chol Calc (NIH): 143 mg/dL — ABNORMAL HIGH (ref 0–99)
LDL/HDL Ratio: 2.9 ratio (ref 0.0–3.6)
Triglycerides: 201 mg/dL — ABNORMAL HIGH (ref 0–149)
VLDL Cholesterol Cal: 36 mg/dL (ref 5–40)

## 2021-10-15 LAB — URIC ACID: Uric Acid: 6.1 mg/dL (ref 3.8–8.4)

## 2021-12-06 DIAGNOSIS — H269 Unspecified cataract: Secondary | ICD-10-CM | POA: Diagnosis not present

## 2021-12-23 ENCOUNTER — Other Ambulatory Visit: Payer: Self-pay | Admitting: Family Medicine

## 2021-12-23 DIAGNOSIS — I10 Essential (primary) hypertension: Secondary | ICD-10-CM

## 2021-12-23 DIAGNOSIS — E7800 Pure hypercholesterolemia, unspecified: Secondary | ICD-10-CM

## 2021-12-23 DIAGNOSIS — G4700 Insomnia, unspecified: Secondary | ICD-10-CM

## 2021-12-24 NOTE — Telephone Encounter (Signed)
LOV:  10/14/2021  NOV:  05/12/2022  Last Refill:   07/12/2021 #90 1 Refill  Thanks,   Marland KitchenMickel Baas

## 2022-03-27 ENCOUNTER — Ambulatory Visit
Admission: RE | Admit: 2022-03-27 | Discharge: 2022-03-27 | Disposition: A | Discharge status: Home / Self Care | Payer: BC Managed Care – PPO | Attending: Urology | Admitting: Urology

## 2022-03-27 ENCOUNTER — Ambulatory Visit: Payer: BC Managed Care – PPO | Admitting: Urology

## 2022-03-27 ENCOUNTER — Encounter: Payer: Self-pay | Admitting: Urology

## 2022-03-27 ENCOUNTER — Ambulatory Visit
Admission: RE | Admit: 2022-03-27 | Discharge: 2022-03-27 | Disposition: A | Discharge status: Home / Self Care | Payer: BC Managed Care – PPO | Source: Ambulatory Visit | Attending: Urology | Admitting: Urology

## 2022-03-27 VITALS — BP 148/91 | HR 65 | Ht 68.0 in | Wt 212.1 lb

## 2022-03-27 DIAGNOSIS — Z87442 Personal history of urinary calculi: Secondary | ICD-10-CM | POA: Diagnosis not present

## 2022-03-27 DIAGNOSIS — Z125 Encounter for screening for malignant neoplasm of prostate: Secondary | ICD-10-CM | POA: Diagnosis not present

## 2022-03-27 DIAGNOSIS — N200 Calculus of kidney: Secondary | ICD-10-CM | POA: Diagnosis not present

## 2022-03-27 NOTE — Progress Notes (Signed)
? ?  03/27/2022 ?10:24 AM  ? ?Roger Chang. ?11-30-1961 ?[id number] ? ?Reason for visit: Follow up BPH, nephrolithiasis, PSA screening ? ?HPI: ?61 year old male who presented in February 2022 with a 5 mm right proximal ureteral stone and poorly controlled pain.  He underwent stent placement at that time for inability to access the collecting system, as well as a bladder biopsy that showed only inflammation.  He underwent follow-up definitive ureteroscopy and uncomplicated stent removal.  He does have a very high bladder neck.  He denies any stone episodes or urinary problems over the last year and has been doing well.  He denies any urinary complaints, no longer taking any medications for ED. ? ?I personally viewed and interpreted his KUB today that shows no obvious evidence of stones. ? ?We reviewed the AUA guidelines regarding PSA screening, and his PSA has been within the normal range, most recently 1.2 in October 2022. ? ?We discussed general stone prevention strategies including adequate hydration with goal of producing 2.5 L of urine daily, increasing citric acid intake, increasing calcium intake during high oxalate meals, minimizing animal protein, and decreasing salt intake. Information about dietary recommendations given today.  ? ?Okay to follow-up with urology as needed ?Consider SWL in the future for any stone episodes secondary to very tight ureters and high bladder neck making ureteroscopy challenging ? ?Sondra Come, MD ? ?[facility] ?[address] ?[place], [place] [address] ?[phone] ? ? ?

## 2022-03-27 NOTE — Patient Instructions (Signed)
Dietary Guidelines to Help Prevent Kidney Stones Kidney stones are deposits of minerals and salts that form inside your kidneys. Your risk of developing kidney stones may be greater depending on your diet, your lifestyle, the medicines you take, and whether you have certain medical conditions. Most people can lower their chances of developing kidney stones by following the instructions below. Your dietitian may give you more specific instructions depending on your overall health and the type of kidney stones you tend to develop. What are tips for following this plan? Reading food labels  Choose foods with "no salt added" or "low-salt" labels. Limit your salt (sodium) intake to less than 1,500 mg a day. Choose foods with calcium for each meal and snack. Try to eat about 300 mg of calcium at each meal. Foods that contain 200-500 mg of calcium a serving include: 8 oz (237 mL) of milk, calcium-fortifiednon-dairy milk, and calcium-fortifiedfruit juice. Calcium-fortified means that calcium has been added to these drinks. 8 oz (237 mL) of kefir, yogurt, and soy yogurt. 4 oz (114 g) of tofu. 1 oz (28 g) of cheese. 1 cup (150 g) of dried figs. 1 cup (91 g) of cooked broccoli. One 3 oz (85 g) can of sardines or mackerel. Most people need 1,000-1,500 mg of calcium a day. Talk to your dietitian about how much calcium is recommended for you. Shopping Buy plenty of fresh fruits and vegetables. Most people do not need to avoid fruits and vegetables, even if these foods contain nutrients that may contribute to kidney stones. When shopping for convenience foods, choose: Whole pieces of fruit. Pre-made salads with dressing on the side. Low-fat fruit and yogurt smoothies. Avoid buying frozen meals or prepared deli foods. These can be high in sodium. Look for foods with live cultures, such as yogurt and kefir. Choose high-fiber grains, such as whole-wheat breads, oat bran, and wheat cereals. Cooking Do not add  salt to food when cooking. Place a salt shaker on the table and allow each person to add his or her own salt to taste. Use vegetable protein, such as beans, textured vegetable protein (TVP), or tofu, instead of meat in pasta, casseroles, and soups. Meal planning Eat less salt, if told by your dietitian. To do this: Avoid eating processed or pre-made food. Avoid eating fast food. Eat less animal protein, including cheese, meat, poultry, or fish, if told by your dietitian. To do this: Limit the number of times you have meat, poultry, fish, or cheese each week. Eat a diet free of meat at least 2 days a week. Eat only one serving each day of meat, poultry, fish, or seafood. When you prepare animal protein, cut pieces into small portion sizes. For most meat and fish, one serving is about the size of the palm of your hand. Eat at least five servings of fresh fruits and vegetables each day. To do this: Keep fruits and vegetables on hand for snacks. Eat one piece of fruit or a handful of berries with breakfast. Have a salad and fruit at lunch. Have two kinds of vegetables at dinner. Limit foods that are high in a substance called oxalate. These include: Spinach (cooked), rhubarb, beets, sweet potatoes, and Swiss chard. Peanuts. Potato chips, french fries, and baked potatoes with skin on. Nuts and nut products. Chocolate. If you regularly take a diuretic medicine, make sure to eat at least 1 or 2 servings of fruits or vegetables that are high in potassium each day. These include: Avocado. Banana. Orange, prune,   carrot, or tomato juice. Baked potato. Cabbage. Beans and split peas. Lifestyle  Drink enough fluid to keep your urine pale yellow. This is the most important thing you can do. Spread your fluid intake throughout the day. If you drink alcohol: Limit how much you use to: 0-1 drink a day for women who are not pregnant. 0-2 drinks a day for men. Be aware of how much alcohol is in your  drink. In the [place] one drink equals one 12 oz bottle of beer (355 mL), one 5 oz glass of wine (148 mL), or one 1 oz glass of hard liquor (44 mL). Lose weight if told by your health care provider. Work with your dietitian to find an eating plan and weight loss strategies that work best for you. General information Talk to your health care provider and dietitian about taking daily supplements. You may be told the following depending on your health and the cause of your kidney stones: Not to take supplements with vitamin C. To take a calcium supplement. To take a daily probiotic supplement. To take other supplements such as magnesium, fish oil, or vitamin B6. Take over-the-counter and prescription medicines only as told by your health care provider. These include supplements. What foods should I limit? Limit your intake of the following foods, or eat them as told by your dietitian. Vegetables Spinach. Rhubarb. Beets. Canned vegetables. Pickles. Olives. Baked potatoes with skin. Grains Wheat bran. Baked goods. Salted crackers. Cereals high in sugar. Meats and other proteins Nuts. Nut butters. Large portions of meat, poultry, or fish. Salted, precooked, or cured meats, such as sausages, meat loaves, and hot dogs. Dairy Cheese. Beverages Regular soft drinks. Regular vegetable juice. Seasonings and condiments Seasoning blends with salt. Salad dressings. Soy sauce. Ketchup. Barbecue sauce. Other foods Canned soups. Canned pasta sauce. Casseroles. Pizza. Lasagna. Frozen meals. Potato chips. French fries. The items listed above may not be a complete list of foods and beverages you should limit. Contact a dietitian for more information. What foods should I avoid? Talk to your dietitian about specific foods you should avoid based on the type of kidney stones you have and your overall health. Fruits Grapefruit. The item listed above may not be a complete list of foods and beverages you should  avoid. Contact a dietitian for more information. Summary Kidney stones are deposits of minerals and salts that form inside your kidneys. You can lower your risk of kidney stones by making changes to your diet. The most important thing you can do is drink enough fluid. Drink enough fluid to keep your urine pale yellow. Talk to your dietitian about how much calcium you should have each day, and eat less salt and animal protein as told by your dietitian. This information is not intended to replace advice given to you by your health care provider. Make sure you discuss any questions you have with your health care provider. Document Revised: 12/01/2019 Document Reviewed: 12/01/2019 Elsevier Patient Education  2022 Elsevier Inc.  

## 2022-05-12 ENCOUNTER — Encounter: Payer: Self-pay | Admitting: Family Medicine

## 2022-05-12 ENCOUNTER — Ambulatory Visit: Payer: BC Managed Care – PPO | Admitting: Family Medicine

## 2022-05-12 VITALS — BP 127/81 | HR 64 | Resp 16 | Ht 68.0 in | Wt 212.0 lb

## 2022-05-12 DIAGNOSIS — N529 Male erectile dysfunction, unspecified: Secondary | ICD-10-CM

## 2022-05-12 DIAGNOSIS — I10 Essential (primary) hypertension: Secondary | ICD-10-CM | POA: Diagnosis not present

## 2022-05-12 DIAGNOSIS — Z23 Encounter for immunization: Secondary | ICD-10-CM

## 2022-05-12 DIAGNOSIS — G4733 Obstructive sleep apnea (adult) (pediatric): Secondary | ICD-10-CM | POA: Diagnosis not present

## 2022-05-12 DIAGNOSIS — N201 Calculus of ureter: Secondary | ICD-10-CM

## 2022-05-12 MED ORDER — TADALAFIL 20 MG PO TABS
10.0000 mg | ORAL_TABLET | ORAL | 11 refills | Status: AC | PRN
Start: 2022-05-12 — End: ?

## 2022-05-12 NOTE — Progress Notes (Signed)
Established patient visit  I,[name],acting as a scribe for [name], MD.,have documented all relevant documentation on the behalf of [name], MD,as directed by  [name], MD while in the presence of [name], MD.   Patient: Roger Chang.   DOB: January 08, 1961   61 y.o. Male  MRN: [id number] Visit Date: 05/12/2022  Today's healthcare provider: [name], MD   Chief Complaint  Patient presents with   Follow-up   Hypertension   Subjective    HPI  Delightful 61 year old gentleman comes in today for follow-up.  He is widowed and the father of 2 grown sons. He continues to work for [employer] and hopes to work for at least another year or 2.  He is now dating someone. He would like a refill on Cialis which helped him before with mild ED.  He has not had it in about a year. He is taking other medications as prescribed and has had no flares of gout. Hypertension, follow-up  BP Readings from Last 3 Encounters:  05/12/22 127/81  03/27/22 (!) 148/91  10/14/21 121/90   Wt Readings from Last 3 Encounters:  05/12/22 212 lb (96.2 kg)  03/27/22 212 lb 1.6 oz (96.2 kg)  10/14/21 209 lb (94.8 kg)     He was last seen for hypertension 7 months ago.  Management since that visit includes; taking diltiazem 180 mg. Outside blood pressures are not checking.  Pertinent labs Lab Results  Component Value Date   CHOL 228 (H) 10/14/2021   HDL 49 10/14/2021   LDLCALC 143 (H) 10/14/2021   TRIG 201 (H) 10/14/2021   CHOLHDL 5.1 (H) 10/09/2020   Lab Results  Component Value Date   NA 139 10/14/2021   K 4.6 10/14/2021   CREATININE 1.34 (H) 10/14/2021   EGFR 60 10/14/2021   GLUCOSE 110 (H) 10/14/2021   TSH 1.550 10/14/2021     The 10-year ASCVD risk score (Arnett DK, et al., 2019) is: 14.9%  ---------------------------------------------------------------------------------------------------   Medications: Outpatient Medications Prior to  Visit  Medication Sig   allopurinol (ZYLOPRIM) 100 MG tablet Take 1 tablet (100 mg total) by mouth daily.   cholecalciferol (VITAMIN D) 1000 UNITS tablet Take 1,000 Units by mouth daily.   diltiazem (CARDIZEM CD) 180 MG 24 hr capsule TAKE 1 CAPSULE BY MOUTH EVERY DAY   eszopiclone (LUNESTA) 2 MG TABS tablet TAKE 1 TABLET(2 MG) BY MOUTH AT BEDTIME AS NEEDED FOR SLEEP   labetalol (NORMODYNE) 200 MG tablet TAKE 1 TABLET BY MOUTH EVERY DAY   pravastatin (PRAVACHOL) 40 MG tablet TAKE 1 TABLET BY MOUTH EVERY DAY   No facility-administered medications prior to visit.    Review of Systems  Constitutional:  Negative for appetite change, chills and fever.  Respiratory:  Negative for chest tightness, shortness of breath and wheezing.   Cardiovascular:  Negative for chest pain and palpitations.  Gastrointestinal:  Negative for abdominal pain, nausea and vomiting.   Last metabolic panel Lab Results  Component Value Date   GLUCOSE 110 (H) 10/14/2021   NA 139 10/14/2021   K 4.6 10/14/2021   CL 101 10/14/2021   CO2 22 10/14/2021   BUN 16 10/14/2021   CREATININE 1.34 (H) 10/14/2021   EGFR 60 10/14/2021   CALCIUM 10.3 (H) 10/14/2021   PROT 7.2 10/14/2021   ALBUMIN 5.0 (H) 10/14/2021   LABGLOB 2.2 10/14/2021   AGRATIO 2.3 (H) 10/14/2021   BILITOT 0.5 10/14/2021  ALKPHOS 35 (L) 10/14/2021   AST 22 10/14/2021   ALT 22 10/14/2021   ANIONGAP 9 02/13/2021       Objective    BP 127/81 (BP Location: Right Arm, Patient Position: Sitting, Cuff Size: Large)   Pulse 64   Resp 16   Ht 5' 8"  (1.727 m)   Wt 212 lb (96.2 kg)   SpO2 99%   BMI 32.23 kg/m  BP Readings from Last 3 Encounters:  05/12/22 127/81  03/27/22 (!) 148/91  10/14/21 121/90   Wt Readings from Last 3 Encounters:  05/12/22 212 lb (96.2 kg)  03/27/22 212 lb 1.6 oz (96.2 kg)  10/14/21 209 lb (94.8 kg)      Physical Exam Vitals reviewed.  Constitutional:      General: He is not in acute distress.    Appearance: He is  well-developed.  HENT:     Head: Normocephalic and atraumatic.     Right Ear: Hearing normal.     Left Ear: Hearing normal.     Nose: Nose normal.  Eyes:     General: Lids are normal. No scleral icterus.       Right eye: No discharge.        Left eye: No discharge.     Conjunctiva/sclera: Conjunctivae normal.  Cardiovascular:     Rate and Rhythm: Normal rate and regular rhythm.     Heart sounds: Normal heart sounds.  Pulmonary:     Effort: Pulmonary effort is normal. No respiratory distress.  Skin:    General: Skin is warm and dry.     Findings: No lesion or rash.  Neurological:     General: No focal deficit present.     Mental Status: He is alert and oriented to person, place, and time.  Psychiatric:        Mood and Affect: Mood normal.        Speech: Speech normal.        Behavior: Behavior normal.        Thought Content: Thought content normal.        Judgment: Judgment normal.      No results found for any visits on 05/12/22.  Assessment & Plan     1. Essential hypertension Excellent control.  2. Need for shingles vaccine  - Varicella-zoster vaccine IM (Shingrix)  3. Obstructive sleep apnea syndrome On CPAP.  4. Ureteral stone Followed by urology.  5. Vasculogenic erectile dysfunction, unspecified vasculogenic erectile dysfunction type Cialis 20 mg every 3 days.  #60 with 11 refills   Return in about 5 months (around 10/12/2022).      I, [name], CMA, have reviewed all documentation for this visit. The documentation on 05/12/22 for the exam, diagnosis, procedures, and orders are all accurate and complete.    Billyjack Trompeter Cranford Mon, MD  [facility] [phone] (phone) [phone] (fax)  [place]

## 2022-06-16 ENCOUNTER — Other Ambulatory Visit: Payer: Self-pay | Admitting: Family Medicine

## 2022-06-16 DIAGNOSIS — E7800 Pure hypercholesterolemia, unspecified: Secondary | ICD-10-CM

## 2022-06-16 DIAGNOSIS — I10 Essential (primary) hypertension: Secondary | ICD-10-CM

## 2022-06-16 DIAGNOSIS — M109 Gout, unspecified: Secondary | ICD-10-CM

## 2022-08-29 NOTE — Progress Notes (Unsigned)
Established patient visit  I,[name],acting as a scribe for Roger Durie, MD.,have documented all relevant documentation on the behalf of Roger Durie, MD,as directed by  Roger Durie, MD while in the presence of Roger Durie, MD.   Patient: Roger Chang.   DOB: [birth year]   61 y.o. Male  MRN: [id number] Visit Date: 09/01/2022  Today's healthcare provider: [name], MD   Chief Complaint  Patient presents with   Follow-up   Hypertension   Subjective    HPI  Overall patient feels well and has no complaints. The only issue he has from a medical standpoint this is a CPAP is falling apart after using it for 10 years. We need to order a new auto titrating CPAP with mask and supplies  Hypertension, follow-up  BP Readings from Last 3 Encounters:  09/01/22 124/78  05/12/22 127/81  03/27/22 (!) 148/91   Wt Readings from Last 3 Encounters:  09/01/22 213 lb (96.6 kg)  05/12/22 212 lb (96.2 kg)  03/27/22 212 lb 1.6 oz (96.2 kg)     He was last seen for hypertension 4 months ago.  Management since that visit includes; Excellent control.  Outside blood pressures are not checking.  Pertinent labs Lab Results  Component Value Date   CHOL 228 (H) 10/14/2021   HDL 49 10/14/2021   LDLCALC 143 (H) 10/14/2021   TRIG 201 (H) 10/14/2021   CHOLHDL 5.1 (H) 10/09/2020   Lab Results  Component Value Date   NA 139 10/14/2021   K 4.6 10/14/2021   CREATININE 1.34 (H) 10/14/2021   EGFR 60 10/14/2021   GLUCOSE 110 (H) 10/14/2021   TSH 1.550 10/14/2021     The 10-year ASCVD risk score (Arnett DK, et al., 2019) is: 14.8%  ---------------------------------------------------------------------------------------------------   Medications: Outpatient Medications Prior to Visit  Medication Sig   allopurinol (ZYLOPRIM) 100 MG tablet TAKE 1 TABLET(100 MG) BY MOUTH DAILY   cholecalciferol (VITAMIN D) 1000 UNITS tablet Take 1,000 Units by mouth  daily.   diltiazem (CARDIZEM CD) 180 MG 24 hr capsule TAKE 1 CAPSULE BY MOUTH EVERY DAY   eszopiclone (LUNESTA) 2 MG TABS tablet TAKE 1 TABLET(2 MG) BY MOUTH AT BEDTIME AS NEEDED FOR SLEEP   labetalol (NORMODYNE) 200 MG tablet TAKE 1 TABLET BY MOUTH EVERY DAY   pravastatin (PRAVACHOL) 40 MG tablet TAKE 1 TABLET BY MOUTH EVERY DAY   tadalafil (CIALIS) 20 MG tablet Take 0.5-1 tablets (10-20 mg total) by mouth every other day as needed for erectile dysfunction.   No facility-administered medications prior to visit.    Review of Systems  Constitutional:  Negative for appetite change, chills and fever.  Respiratory:  Negative for chest tightness, shortness of breath and wheezing.   Cardiovascular:  Negative for chest pain and palpitations.  Gastrointestinal:  Negative for abdominal pain, nausea and vomiting.    Last lipids Lab Results  Component Value Date   CHOL 197 09/01/2022   HDL 41 09/01/2022   LDLCALC 125 (H) 09/01/2022   TRIG 176 (H) 09/01/2022   CHOLHDL 4.8 09/01/2022       Objective    BP 124/78 (BP Location: Right Arm, Patient Position: Sitting, Cuff Size: Large)   Pulse 69   Resp 14   Wt 213 lb (96.6 kg)   SpO2 98%   BMI 32.39 kg/m  BP Readings from Last 3 Encounters:  09/01/22 124/78  05/12/22 127/81  03/27/22 (!) 148/91  Wt Readings from Last 3 Encounters:  09/01/22 213 lb (96.6 kg)  05/12/22 212 lb (96.2 kg)  03/27/22 212 lb 1.6 oz (96.2 kg)      Physical Exam Vitals reviewed.  Constitutional:      General: He is not in acute distress.    Appearance: He is well-developed.  HENT:     Head: Normocephalic and atraumatic.     Right Ear: Hearing normal.     Left Ear: Hearing normal.     Nose: Nose normal.  Eyes:     General: Lids are normal. No scleral icterus.       Right eye: No discharge.        Left eye: No discharge.     Conjunctiva/sclera: Conjunctivae normal.  Cardiovascular:     Rate and Rhythm: Normal rate and regular rhythm.     Heart  sounds: Normal heart sounds.  Pulmonary:     Effort: Pulmonary effort is normal. No respiratory distress.  Skin:    General: Skin is warm and dry.     Findings: No lesion or rash.  Neurological:     General: No focal deficit present.     Mental Status: He is alert and oriented to person, place, and time.  Psychiatric:        Mood and Affect: Mood normal.        Speech: Speech normal.        Behavior: Behavior normal.        Thought Content: Thought content normal.        Judgment: Judgment normal.       No results found for any visits on 09/01/22.  Assessment & Plan     1. Essential hypertension  - Comprehensive Metabolic Panel (CMET) - CBC w/Diff/Platelet  2. Need for influenza vaccination  - Flu Vaccine QUAD 60moIM (Fluarix, Fluzone & Alfiuria Quad PF)  3. Gout, unspecified cause, unspecified chronicity, unspecified site  - Uric acid  4. Chronic kidney disease, unspecified CKD stage  - Comprehensive Metabolic Panel (CMET) - CBC w/Diff/Platelet  5. Elevated hemoglobin A1c  - HgB A1c  6. Kidney stones   7. Pure hypercholesterolemia  - Comprehensive Metabolic Panel (CMET) - Lipid Profile 8.  Sleep apnea Needs new auto titrating CPAP  No follow-ups on file.      I, RWilhemena Durie MD, have reviewed all documentation for this visit. The documentation on 09/03/22 for the exam, diagnosis, procedures, and orders are all accurate and complete.    Joelle Roswell GCranford Mon MD  [phone](phone) [phone](fax)  [place]

## 2022-09-01 ENCOUNTER — Ambulatory Visit: Payer: BC Managed Care – PPO | Admitting: Family Medicine

## 2022-09-01 ENCOUNTER — Encounter: Payer: Self-pay | Admitting: Family Medicine

## 2022-09-01 VITALS — BP 124/78 | HR 69 | Resp 14 | Wt 213.0 lb

## 2022-09-01 DIAGNOSIS — R7309 Other abnormal glucose: Secondary | ICD-10-CM

## 2022-09-01 DIAGNOSIS — E7800 Pure hypercholesterolemia, unspecified: Secondary | ICD-10-CM

## 2022-09-01 DIAGNOSIS — N200 Calculus of kidney: Secondary | ICD-10-CM

## 2022-09-01 DIAGNOSIS — I10 Essential (primary) hypertension: Secondary | ICD-10-CM | POA: Diagnosis not present

## 2022-09-01 DIAGNOSIS — Z23 Encounter for immunization: Secondary | ICD-10-CM | POA: Diagnosis not present

## 2022-09-01 DIAGNOSIS — N189 Chronic kidney disease, unspecified: Secondary | ICD-10-CM

## 2022-09-01 DIAGNOSIS — M109 Gout, unspecified: Secondary | ICD-10-CM

## 2022-09-01 DIAGNOSIS — G4733 Obstructive sleep apnea (adult) (pediatric): Secondary | ICD-10-CM

## 2022-09-02 LAB — CBC WITH DIFFERENTIAL/PLATELET
Basophils Absolute: 0.1 10*3/uL (ref 0.0–0.2)
Basos: 2 %
EOS (ABSOLUTE): 0.4 10*3/uL (ref 0.0–0.4)
Eos: 8 %
Hematocrit: 47.9 % (ref 37.5–51.0)
Hemoglobin: 16.2 g/dL (ref 13.0–17.7)
Immature Grans (Abs): 0 10*3/uL (ref 0.0–0.1)
Immature Granulocytes: 0 %
Lymphocytes Absolute: 2.2 10*3/uL (ref 0.7–3.1)
Lymphs: 47 %
MCH: 30.5 pg (ref 26.6–33.0)
MCHC: 33.8 g/dL (ref 31.5–35.7)
MCV: 90 fL (ref 79–97)
Monocytes Absolute: 0.6 10*3/uL (ref 0.1–0.9)
Monocytes: 11 %
Neutrophils Absolute: 1.6 10*3/uL (ref 1.4–7.0)
Neutrophils: 32 %
Platelets: 275 10*3/uL (ref 150–450)
RBC: 5.32 x10E6/uL (ref 4.14–5.80)
RDW: 13.1 % (ref 11.6–15.4)
WBC: 4.9 10*3/uL (ref 3.4–10.8)

## 2022-09-02 LAB — LIPID PANEL
Chol/HDL Ratio: 4.8 ratio (ref 0.0–5.0)
Cholesterol, Total: 197 mg/dL (ref 100–199)
HDL: 41 mg/dL (ref >39)
LDL Chol Calc (NIH): 125 mg/dL — ABNORMAL HIGH (ref 0–99)
Triglycerides: 176 mg/dL — ABNORMAL HIGH (ref 0–149)
VLDL Cholesterol Cal: 31 mg/dL (ref 5–40)

## 2022-09-02 LAB — COMPREHENSIVE METABOLIC PANEL
ALT: 23 IU/L (ref 0–44)
AST: 23 IU/L (ref 0–40)
Albumin/Globulin Ratio: 2 (ref 1.2–2.2)
Albumin: 4.8 g/dL (ref 3.9–4.9)
Alkaline Phosphatase: 36 IU/L — ABNORMAL LOW (ref 44–121)
BUN/Creatinine Ratio: 11 (ref 10–24)
BUN: 13 mg/dL (ref 8–27)
Bilirubin Total: 0.3 mg/dL (ref 0.0–1.2)
CO2: 21 mmol/L (ref 20–29)
Calcium: 10.2 mg/dL (ref 8.6–10.2)
Chloride: 105 mmol/L (ref 96–106)
Creatinine, Ser: 1.15 mg/dL (ref 0.76–1.27)
Globulin, Total: 2.4 g/dL (ref 1.5–4.5)
Glucose: 104 mg/dL — ABNORMAL HIGH (ref 70–99)
Potassium: 4.3 mmol/L (ref 3.5–5.2)
Sodium: 143 mmol/L (ref 134–144)
Total Protein: 7.2 g/dL (ref 6.0–8.5)
eGFR: 72 mL/min/{1.73_m2} (ref >59)

## 2022-09-02 LAB — URIC ACID: Uric Acid: 5.7 mg/dL (ref 3.8–8.4)

## 2022-09-02 LAB — HEMOGLOBIN A1C
Est. average glucose Bld gHb Est-mCnc: 131 mg/dL
Hgb A1c MFr Bld: 6.2 % — ABNORMAL HIGH (ref 4.8–5.6)

## 2022-09-12 ENCOUNTER — Telehealth: Payer: Self-pay

## 2022-09-12 NOTE — Patient Outreach (Signed)
  Care Coordination   09/12/2022 Name: Roger Chang. MRN: [id number] DOB: 1961/11/20   Care Coordination Outreach Attempts:  An unsuccessful telephone outreach was attempted today to offer the patient information about available care coordination services as a benefit of their health plan.   Follow Up Plan:  Additional outreach attempts will be made to offer the patient care coordination information and services.   Encounter Outcome:  No Answer  Care Coordination Interventions Activated:  No   Care Coordination Interventions:  No, not indicated    Noreene Larsson RN, MSN, [facility]  Mobile: [phone]

## 2022-09-23 DIAGNOSIS — G4733 Obstructive sleep apnea (adult) (pediatric): Secondary | ICD-10-CM | POA: Diagnosis not present

## 2022-09-23 DIAGNOSIS — I10 Essential (primary) hypertension: Secondary | ICD-10-CM | POA: Diagnosis not present

## 2022-09-23 DIAGNOSIS — E785 Hyperlipidemia, unspecified: Secondary | ICD-10-CM | POA: Diagnosis not present

## 2022-09-23 DIAGNOSIS — R0683 Snoring: Secondary | ICD-10-CM | POA: Diagnosis not present

## 2022-10-20 ENCOUNTER — Ambulatory Visit: Payer: BC Managed Care – PPO | Admitting: Family Medicine

## 2022-10-24 DIAGNOSIS — R0683 Snoring: Secondary | ICD-10-CM | POA: Diagnosis not present

## 2022-10-24 DIAGNOSIS — G4733 Obstructive sleep apnea (adult) (pediatric): Secondary | ICD-10-CM | POA: Diagnosis not present

## 2022-10-24 DIAGNOSIS — E785 Hyperlipidemia, unspecified: Secondary | ICD-10-CM | POA: Diagnosis not present

## 2022-10-24 DIAGNOSIS — I10 Essential (primary) hypertension: Secondary | ICD-10-CM | POA: Diagnosis not present

## 2022-10-28 IMAGING — US US RENAL
1 series · 14 of 25 positions shown · non-contrast
Comparison: Renal stone CT 01/31/2021

CLINICAL DATA: Ureteral calculus

EXAM:
RENAL / URINARY TRACT ULTRASOUND COMPLETE

[Series 1: us renal · 0.25mm/px · 14 of 45 slices shown]
[im 1/45]
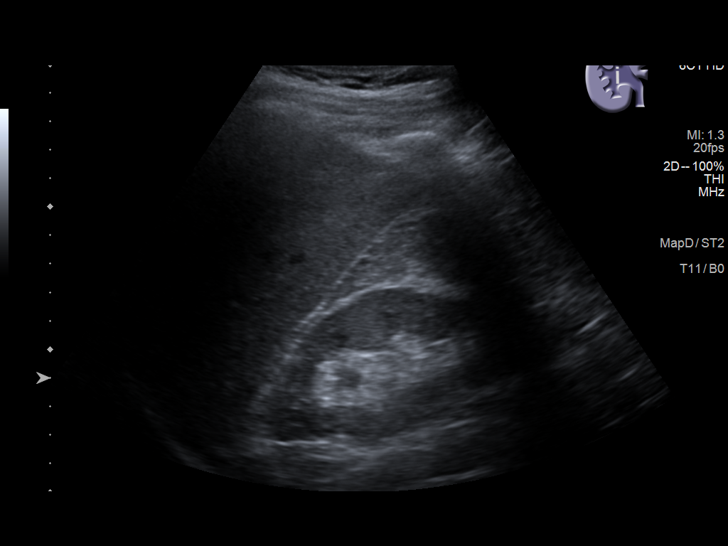
[im 4/45]
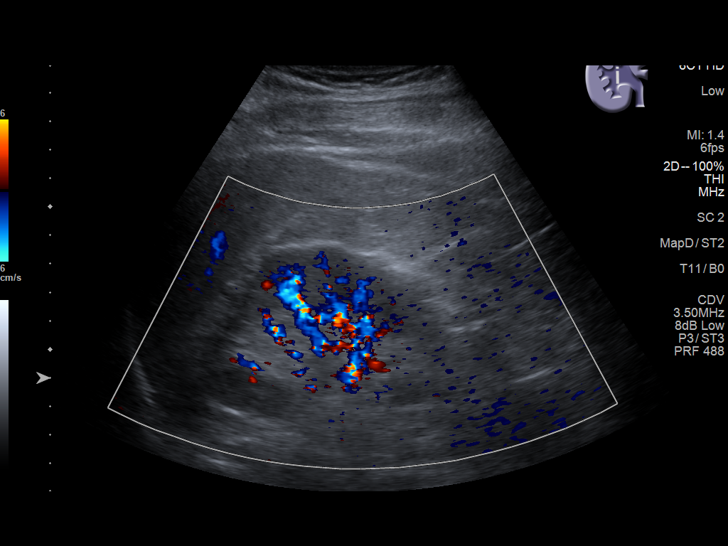
[im 8/45]
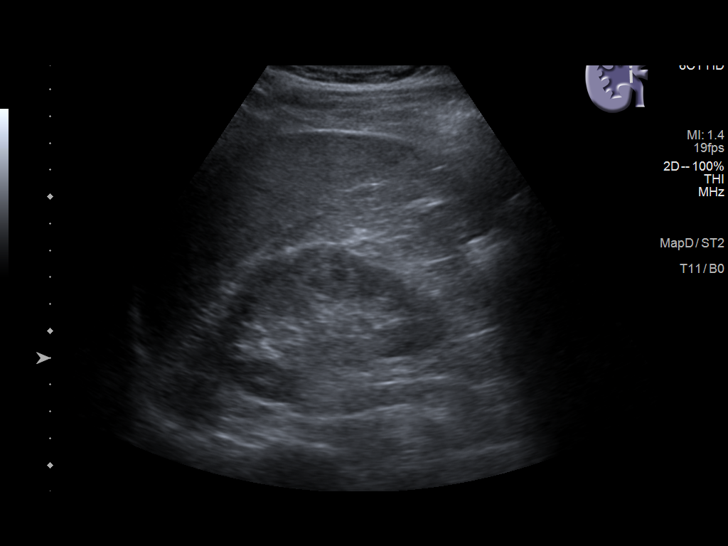
[im 12/45]
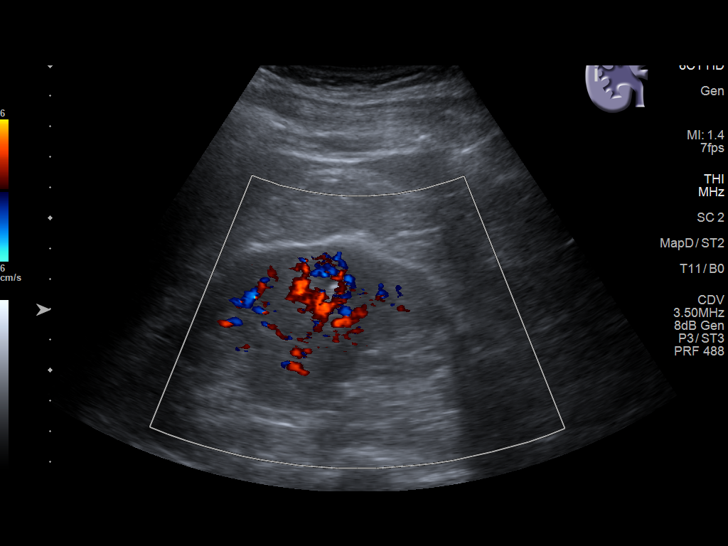
[im 15/45]
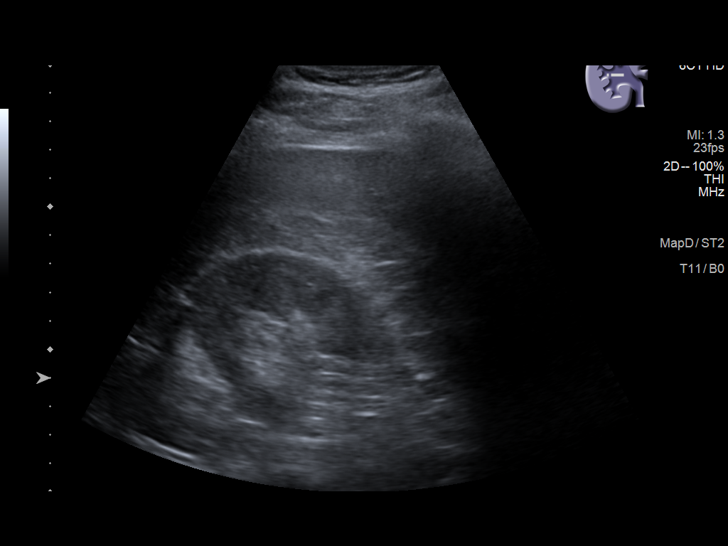
[im 17/45]
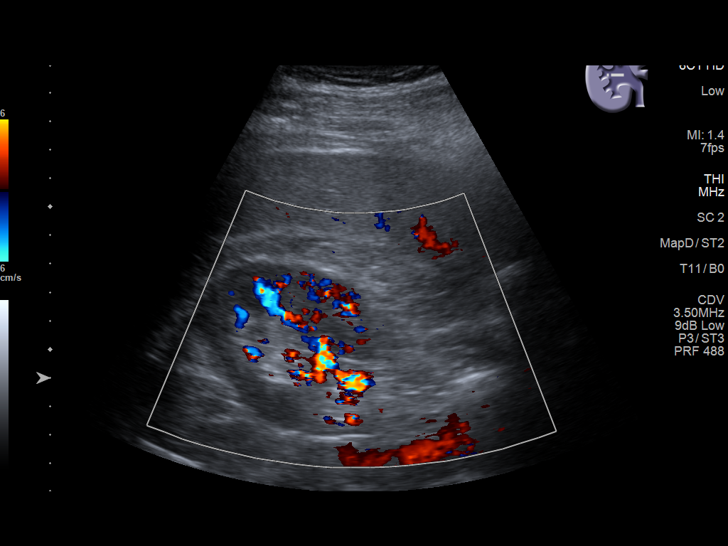
[im 21/45]
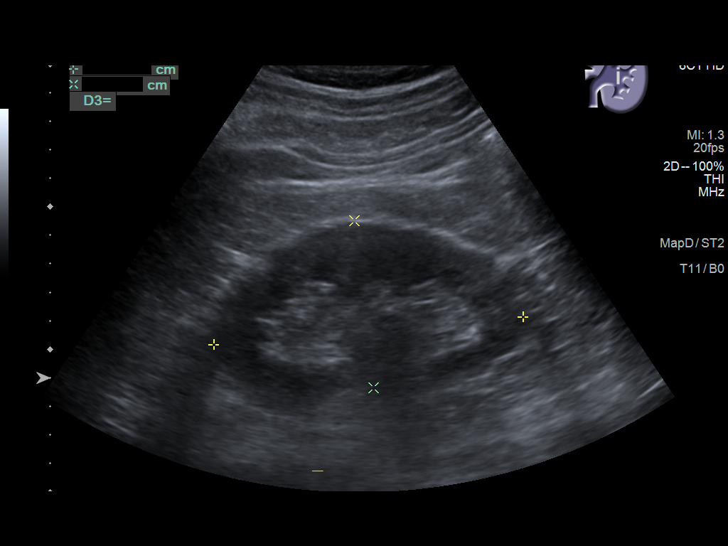
[im 24/45]
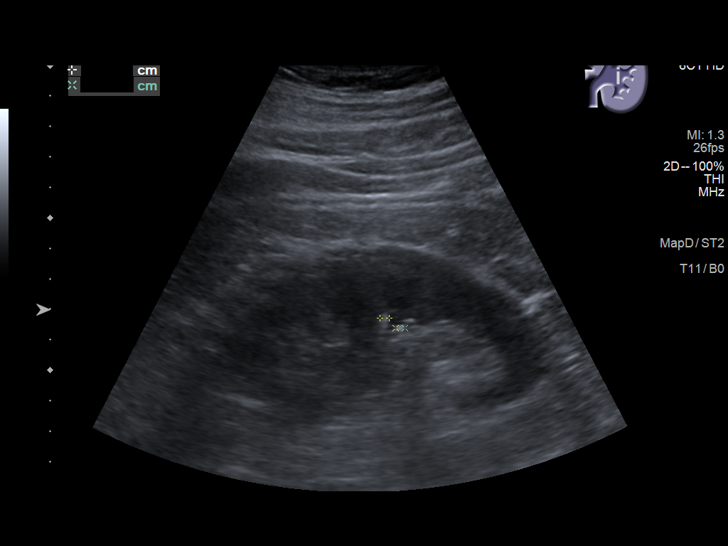
[im 28/45]
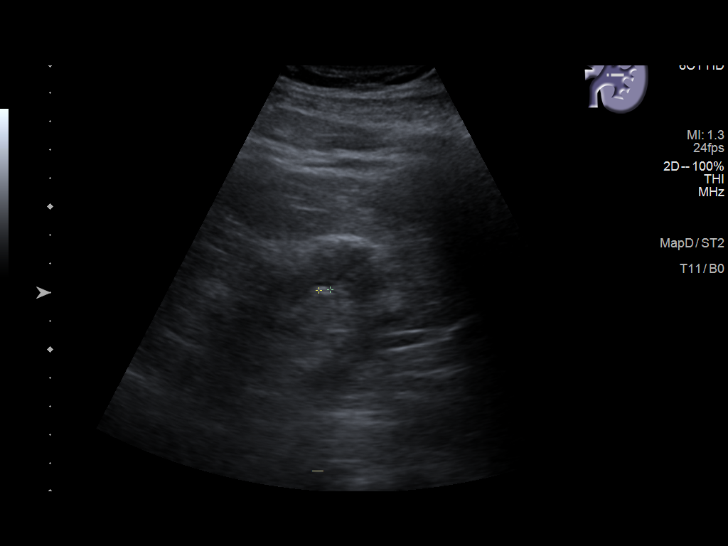
[im 30/45]
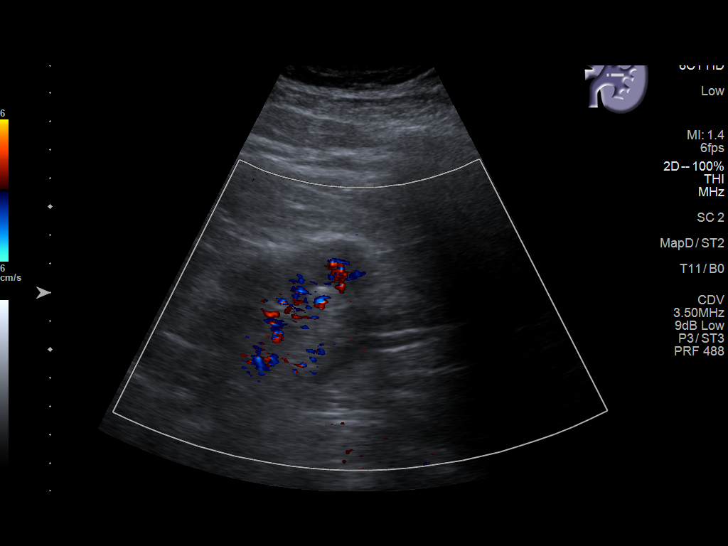
[im 34/45]
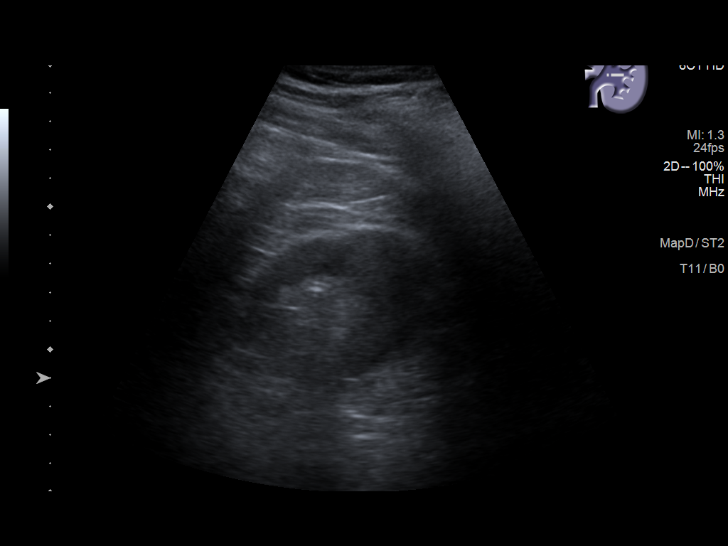
[im 37/45]
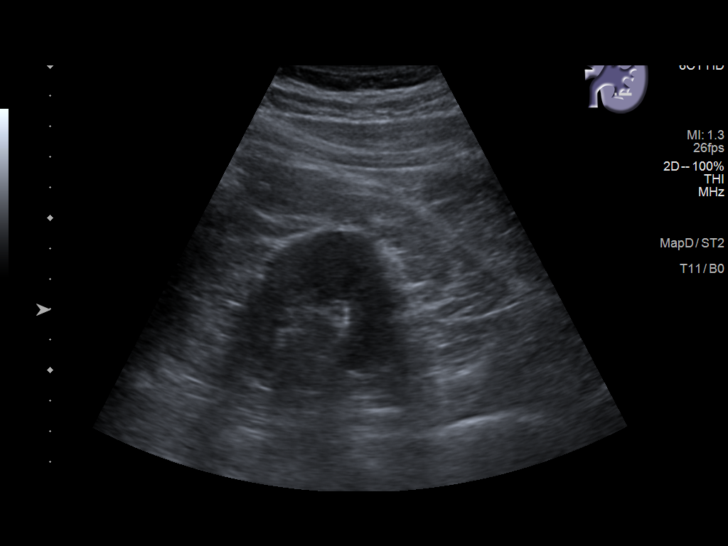
[im 41/45]
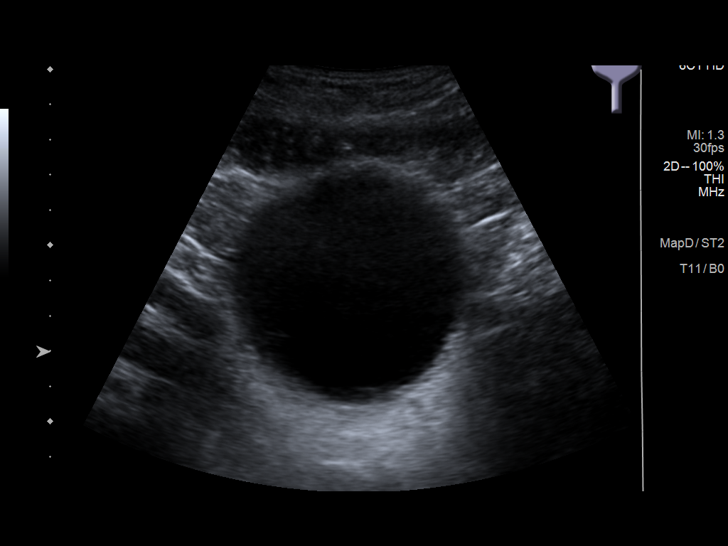
[im 45/45]
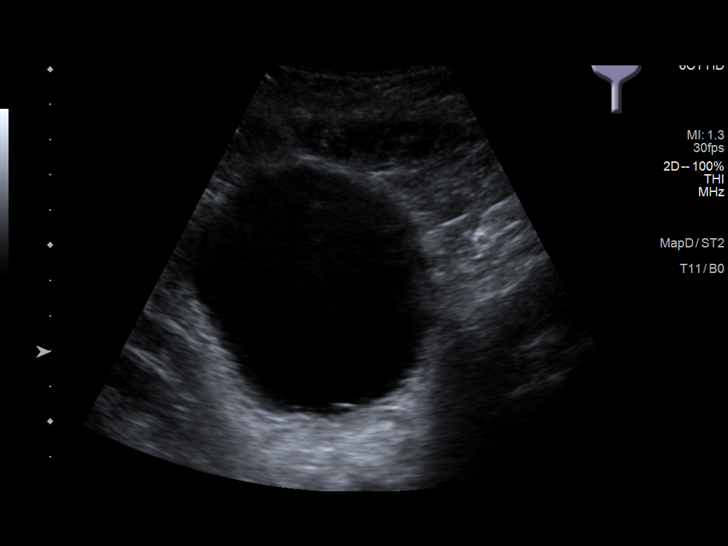

[14 of 25 positions shown; findings below may reference images not displayed]

FINDINGS: Right Kidney:

Renal measurements: 11.0 x 6.3 x 5.5 cm = volume: 201 mL.
Echogenicity within normal limits. No mass or hydronephrosis
visualized. 5 mm echogenic structure in the lower pole the right
kidney consistent with nonobstructing calculus.

Left Kidney:

Renal measurements: 10.9 x 5.9 x 5.2 cm = volume: 175 mL.
Echogenicity within normal limits. No mass or hydronephrosis
visualized. Two nonobstructing calculi seen in the mid left kidney
measuring 3 mm each.

Bladder:

Appears normal for degree of bladder distention.

Other:

None.
IMPRESSION: No hydronephrosis.

Bilateral nonobstructing renal calculi.

## 2022-11-23 DIAGNOSIS — E785 Hyperlipidemia, unspecified: Secondary | ICD-10-CM | POA: Diagnosis not present

## 2022-11-23 DIAGNOSIS — G4733 Obstructive sleep apnea (adult) (pediatric): Secondary | ICD-10-CM | POA: Diagnosis not present

## 2022-11-23 DIAGNOSIS — R0683 Snoring: Secondary | ICD-10-CM | POA: Diagnosis not present

## 2022-11-23 DIAGNOSIS — I10 Essential (primary) hypertension: Secondary | ICD-10-CM | POA: Diagnosis not present

## 2022-12-02 ENCOUNTER — Telehealth: Payer: Self-pay | Admitting: Family Medicine

## 2022-12-02 DIAGNOSIS — I10 Essential (primary) hypertension: Secondary | ICD-10-CM

## 2022-12-02 DIAGNOSIS — E7800 Pure hypercholesterolemia, unspecified: Secondary | ICD-10-CM

## 2022-12-02 MED ORDER — PRAVASTATIN SODIUM 40 MG PO TABS: 40.0000 mg | ORAL_TABLET | Freq: Every day | ORAL | 0 refills | Status: DC

## 2022-12-02 MED ORDER — LABETALOL HCL 200 MG PO TABS: 200.0000 mg | ORAL_TABLET | Freq: Every day | ORAL | 0 refills | Status: DC

## 2022-12-02 MED ORDER — DILTIAZEM HCL ER COATED BEADS 180 MG PO CP24: 180.0000 mg | ORAL_CAPSULE | Freq: Every day | ORAL | 0 refills | Status: DC

## 2022-12-02 NOTE — Telephone Encounter (Signed)
[facility] faxed refill request for the following medications:  pravastatin (PRAVACHOL) 40 MG tablet    labetalol (NORMODYNE) 200 MG tablet   diltiazem (CARDIZEM CD) 180 MG 24 hr capsule    Please advise.

## 2022-12-24 DIAGNOSIS — R0683 Snoring: Secondary | ICD-10-CM | POA: Diagnosis not present

## 2022-12-24 DIAGNOSIS — G4733 Obstructive sleep apnea (adult) (pediatric): Secondary | ICD-10-CM | POA: Diagnosis not present

## 2022-12-24 DIAGNOSIS — I10 Essential (primary) hypertension: Secondary | ICD-10-CM | POA: Diagnosis not present

## 2022-12-24 DIAGNOSIS — E785 Hyperlipidemia, unspecified: Secondary | ICD-10-CM | POA: Diagnosis not present

## 2023-01-09 DIAGNOSIS — Z9889 Other specified postprocedural states: Secondary | ICD-10-CM | POA: Diagnosis not present

## 2023-01-09 DIAGNOSIS — Z961 Presence of intraocular lens: Secondary | ICD-10-CM | POA: Diagnosis not present

## 2023-02-24 ENCOUNTER — Other Ambulatory Visit: Payer: Self-pay | Admitting: Family Medicine

## 2023-02-24 DIAGNOSIS — I10 Essential (primary) hypertension: Secondary | ICD-10-CM

## 2023-02-24 DIAGNOSIS — E7800 Pure hypercholesterolemia, unspecified: Secondary | ICD-10-CM

## 2023-02-25 ENCOUNTER — Other Ambulatory Visit: Payer: Self-pay | Admitting: Family Medicine

## 2023-02-25 ENCOUNTER — Telehealth: Payer: Self-pay | Admitting: Family Medicine

## 2023-02-25 DIAGNOSIS — I10 Essential (primary) hypertension: Secondary | ICD-10-CM

## 2023-02-25 MED ORDER — DILTIAZEM HCL ER COATED BEADS 180 MG PO CP24: ORAL_CAPSULE | ORAL | 0 refills | Status: DC

## 2023-02-25 NOTE — Telephone Encounter (Signed)
Received a fax from [facility] stating that pt's insurance requires a new 90 day prescription for maintenance.  Please fax [place] back for a new prescription approval for Diltiazem

## 2023-02-25 NOTE — Telephone Encounter (Signed)
Pleas review. I believed he was given a 30 day supply to last until he goes with Dr. Rosanna Randy

## 2023-03-31 ENCOUNTER — Other Ambulatory Visit: Payer: Self-pay | Admitting: Family Medicine

## 2023-03-31 ENCOUNTER — Telehealth: Payer: Self-pay | Admitting: Family Medicine

## 2023-03-31 DIAGNOSIS — E7800 Pure hypercholesterolemia, unspecified: Secondary | ICD-10-CM

## 2023-03-31 DIAGNOSIS — I10 Essential (primary) hypertension: Secondary | ICD-10-CM

## 2023-03-31 DIAGNOSIS — M109 Gout, unspecified: Secondary | ICD-10-CM

## 2023-03-31 MED ORDER — ALLOPURINOL 100 MG PO TABS: ORAL_TABLET | ORAL | 0 refills | Status: AC

## 2023-03-31 NOTE — Telephone Encounter (Signed)
[facility] requesting prescription refill allopurinol (ZYLOPRIM) 100 MG tablet   Please advise

## 2023-04-01 ENCOUNTER — Other Ambulatory Visit: Payer: Self-pay | Admitting: Family Medicine

## 2023-04-01 DIAGNOSIS — M109 Gout, unspecified: Secondary | ICD-10-CM

## 2023-04-02 ENCOUNTER — Other Ambulatory Visit: Payer: Self-pay | Admitting: Family Medicine

## 2023-04-02 DIAGNOSIS — M109 Gout, unspecified: Secondary | ICD-10-CM

## 2023-04-10 DIAGNOSIS — I10 Essential (primary) hypertension: Secondary | ICD-10-CM | POA: Diagnosis not present

## 2023-04-10 DIAGNOSIS — Z125 Encounter for screening for malignant neoplasm of prostate: Secondary | ICD-10-CM | POA: Diagnosis not present

## 2023-04-10 DIAGNOSIS — R7303 Prediabetes: Secondary | ICD-10-CM | POA: Diagnosis not present

## 2023-04-10 DIAGNOSIS — E782 Mixed hyperlipidemia: Secondary | ICD-10-CM | POA: Diagnosis not present

## 2023-04-10 DIAGNOSIS — Z0000 Encounter for general adult medical examination without abnormal findings: Secondary | ICD-10-CM | POA: Diagnosis not present

## 2023-04-24 DIAGNOSIS — Z136 Encounter for screening for cardiovascular disorders: Secondary | ICD-10-CM | POA: Diagnosis not present

## 2023-04-28 ENCOUNTER — Other Ambulatory Visit: Payer: Self-pay | Admitting: Family Medicine

## 2023-04-28 DIAGNOSIS — I10 Essential (primary) hypertension: Secondary | ICD-10-CM

## 2023-04-28 DIAGNOSIS — E7800 Pure hypercholesterolemia, unspecified: Secondary | ICD-10-CM

## 2023-04-28 DIAGNOSIS — M109 Gout, unspecified: Secondary | ICD-10-CM

## 2023-04-28 NOTE — Telephone Encounter (Signed)
Requested medications are due for refill today.  yes  Requested medications are on the active medications list.  yes  Last refill. 03/31/2023 #30 0 rf.  Future visit scheduled.   no  Notes to clinic.  No pcp listed. Now seeing Dr. Sullivan Lone.    Requested Prescriptions  Pending Prescriptions Disp Refills   diltiazem (CARDIZEM CD) 180 MG 24 hr capsule [Pharmacy Med Name: DILTIAZEM CD 180MG  CAPSULES (24 HR)] 30 capsule 0    Sig: TAKE 1 CAPSULE(180 MG) BY MOUTH DAILY     Cardiovascular: Calcium Channel Blockers 3 Failed - 04/28/2023  1:38 PM      Failed - Valid encounter within last 6 months    Recent Outpatient Visits           7 months ago Essential hypertension   [place] [facility] Bosie Clos, MD   11 months ago Essential hypertension   [place] [facility] Bosie Clos, MD   1 year ago Annual physical exam   [place] [facility] Bosie Clos, MD   2 years ago Elevated hemoglobin A1c   [facility] Bosie Clos, MD   2 years ago Annual physical exam   [place] [facility] Bosie Clos, MD              Passed - ALT in normal range and within 360 days    ALT  Date Value Ref Range Status  09/01/2022 23 0 - 44 IU/L Final         Passed - AST in normal range and within 360 days    AST  Date Value Ref Range Status  09/01/2022 23 0 - 40 IU/L Final         Passed - Cr in normal range and within 360 days    Creatinine, Ser  Date Value Ref Range Status  09/01/2022 1.15 0.76 - 1.27 mg/dL Final         Passed - Last BP in normal range    BP Readings from Last 1 Encounters:  09/01/22 124/78         Passed - Last Heart Rate in normal range    Pulse Readings from Last 1 Encounters:  09/01/22 69          pravastatin (PRAVACHOL) 40 MG tablet [Pharmacy Med Name: PRAVASTATIN 40MG  TABLETS] 30 tablet 0    Sig: TAKE 1 TABLET(40 MG) BY MOUTH DAILY      Cardiovascular:  Antilipid - Statins Failed - 04/28/2023  1:38 PM      Failed - Lipid Panel in normal range within the last 12 months    Cholesterol, Total  Date Value Ref Range Status  09/01/2022 197 100 - 199 mg/dL Final   LDL Chol Calc (NIH)  Date Value Ref Range Status  09/01/2022 125 (H) 0 - 99 mg/dL Final   HDL  Date Value Ref Range Status  09/01/2022 41 >39 mg/dL Final   Triglycerides  Date Value Ref Range Status  09/01/2022 176 (H) 0 - 149 mg/dL Final         Passed - Patient is not pregnant      Passed - Valid encounter within last 12 months    Recent Outpatient Visits           7 months ago Essential hypertension   [place] [facility] Bosie Clos, MD   11 months ago Essential hypertension  [facility] Clos, MD   1 year ago Annual physical exam   [facility] Bosie Clos, MD   2 years ago Elevated hemoglobin A1c   [facility]ie Clos, MD   2 years ago Annual physical exam   [place] [facility] Bosie Clos, MD               labetalol (NORMODYNE) 200 MG tablet [Pharmacy Med Name: LABETALOL 200MG  TABLETS] 30 tablet 0    Sig: TAKE 1 TABLET(200 MG) BY MOUTH DAILY     Cardiovascular:  Beta Blockers Failed - 04/28/2023  1:38 PM      Failed - Valid encounter within last 6 months    Recent Outpatient Visits           7 months ago Essential hypertension   [place] [facility] Bosie Clos, MD   11 months ago Essential hypertension   [place] [facility] Bosie Clos, MD   1 year ago Annual physical exam   [facility] Bosie Clos, MD   2 years ago Elevated hemoglobin A1c   [facility] Bosie Clos, MD   2 years ago Annual physical exam   [facility]  Bosie Clos, MD              Passed - Last BP in normal range    BP Readings from Last 1 Encounters:  09/01/22 124/78         Passed - Last Heart Rate in normal range    Pulse Readings from Last 1 Encounters:  09/01/22 69

## 2023-04-28 NOTE — Telephone Encounter (Signed)
Pt no longer at pt at [facility] Pt sees Dr. Sullivan Lone at [facility]. Requested Prescriptions  Refused Prescriptions Disp Refills   allopurinol (ZYLOPRIM) 100 MG tablet [Pharmacy Med Name: ALLOPURINOL 100 MG TABLET] 90 tablet 1    Sig: TAKE 1 TABLET BY MOUTH EVERY DAY     Endocrinology:  Gout Agents - allopurinol Passed - 04/28/2023 10:31 AM      Passed - Uric Acid in normal range and within 360 days    Uric Acid  Date Value Ref Range Status  09/01/2022 5.7 3.8 - 8.4 mg/dL Final    Comment:               Therapeutic target for gout patients: <6.0         Passed - Cr in normal range and within 360 days    Creatinine, Ser  Date Value Ref Range Status  09/01/2022 1.15 0.76 - 1.27 mg/dL Final         Passed - Valid encounter within last 12 months    Recent Outpatient Visits           7 months ago Essential hypertension   [facility] Bosie Clos, MD   11 months ago Essential hypertension   [facility] Bosie Clos, MD   1 year ago Annual physical exam   [facility] Bosie Clos, MD   2 years ago Elevated hemoglobin A1c    [facility] Bosie Clos, MD   2 years ago Annual physical exam   [facility] Bosie Clos, MD              Passed - CBC within normal limits and completed in the last 12 months    WBC  Date Value Ref Range Status  09/01/2022 4.9 3.4 - 10.8 x10E3/uL Final  02/13/2021 5.9 4.0 - 10.5 K/uL Final   RBC  Date Value Ref Range Status  09/01/2022 5.32 4.14 - 5.80 x10E6/uL Final  02/13/2021 5.02 4.22 - 5.81 MIL/uL Final   Hemoglobin  Date Value Ref Range Status  09/01/2022 16.2 13.0 - 17.7 g/dL Final   Hematocrit  Date Value Ref Range Status  09/01/2022 47.9 37.5 - 51.0 % Final   MCHC  Date Value Ref Range Status  09/01/2022 33.8 31.5 - 35.7 g/dL Final  16/09/9603 54.0  30.0 - 36.0 g/dL Final   [facility]  Date Value Ref Range Status  09/01/2022 30.5 26.6 - 33.0 pg Final  02/13/2021 30.7 26.0 - 34.0 pg Final   MCV  Date Value Ref Range Status  09/01/2022 90 79 - 97 fL Final   No results found for: "PLTCOUNTKUC", "LABPLAT", "POCPLA" RDW  Date Value Ref Range Status  09/01/2022 13.1 11.6 - 15.4 % Final

## 2023-05-04 DIAGNOSIS — M19042 Primary osteoarthritis, left hand: Secondary | ICD-10-CM | POA: Diagnosis not present

## 2023-05-04 DIAGNOSIS — M720 Palmar fascial fibromatosis [Dupuytren]: Secondary | ICD-10-CM | POA: Diagnosis not present

## 2023-05-04 DIAGNOSIS — M19041 Primary osteoarthritis, right hand: Secondary | ICD-10-CM | POA: Diagnosis not present

## 2023-05-04 DIAGNOSIS — F4322 Adjustment disorder with anxiety: Secondary | ICD-10-CM | POA: Diagnosis not present

## 2023-10-20 DIAGNOSIS — R7303 Prediabetes: Secondary | ICD-10-CM | POA: Diagnosis not present

## 2023-10-20 DIAGNOSIS — I10 Essential (primary) hypertension: Secondary | ICD-10-CM | POA: Diagnosis not present

## 2023-10-20 DIAGNOSIS — E782 Mixed hyperlipidemia: Secondary | ICD-10-CM | POA: Diagnosis not present

## 2023-10-20 DIAGNOSIS — N200 Calculus of kidney: Secondary | ICD-10-CM | POA: Diagnosis not present

## 2023-10-30 IMAGING — CR DG ABDOMEN 1V
1 series · 2 of 2 positions shown · non-contrast
Comparison: CT abdomen pelvis dated January 31, 2021.

CLINICAL DATA: Kidney stone follow-up.  No current complaints.

EXAM:
ABDOMEN - 1 VIEW

[Series 1: dg abd 1 view · 0.14mm/px · 2 of 2 slices shown]
[im 1/2]
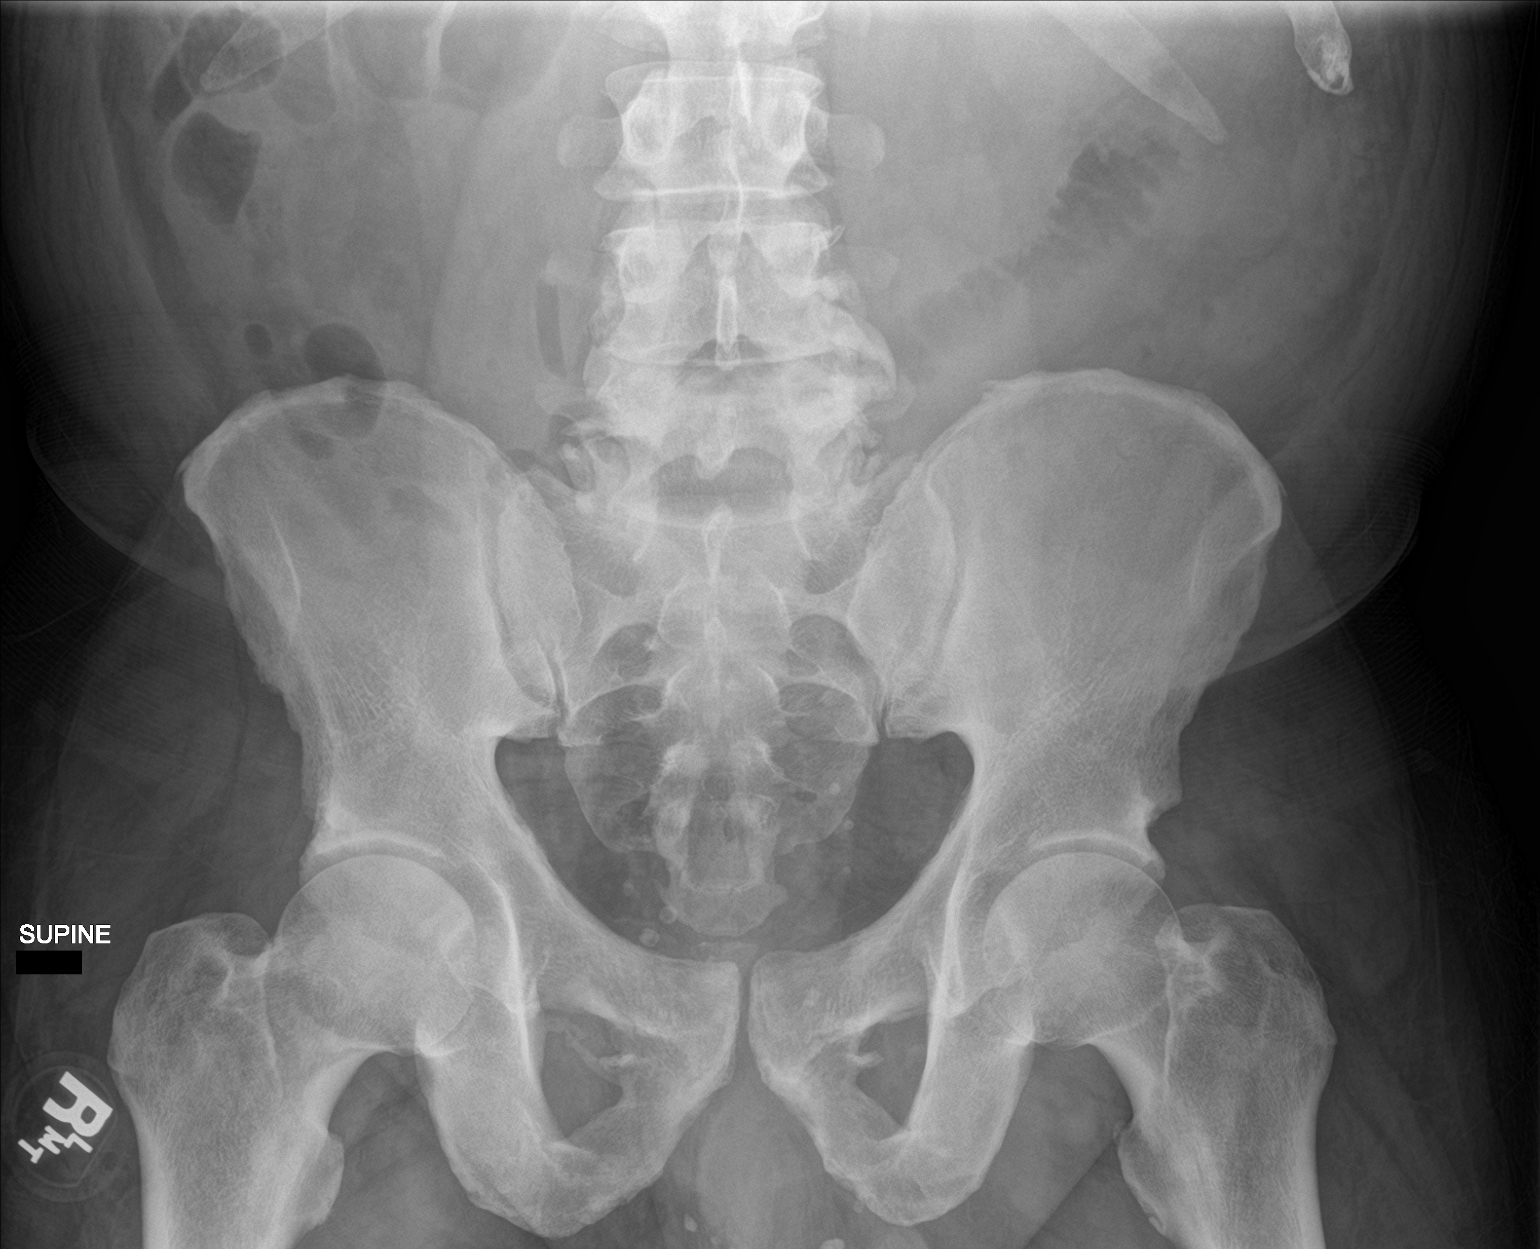
[im 2/2]
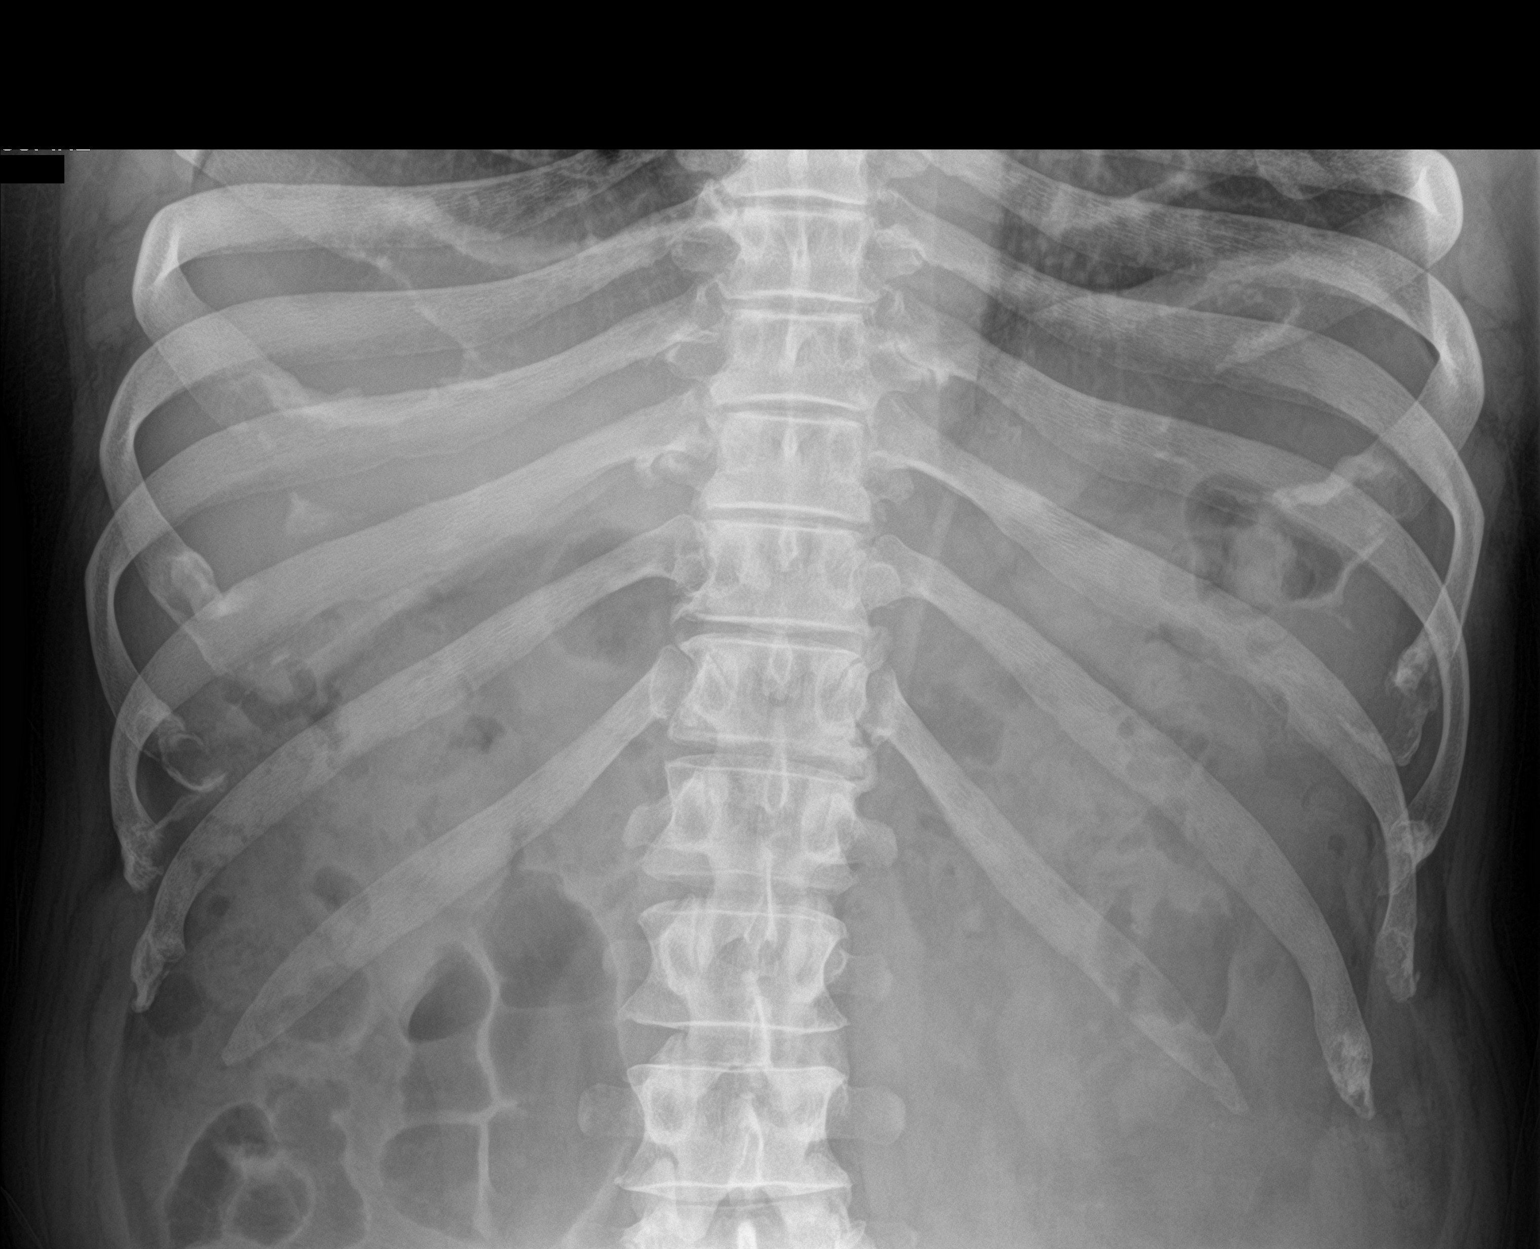

[2 of 2 positions shown; findings below may reference images not displayed]

FINDINGS: The bowel gas pattern is normal. No radio-opaque calculi or other
significant radiographic abnormality are seen.
IMPRESSION: Negative.
